# Patient Record
Sex: Female | Born: 1950 | Race: White | Hispanic: No | Marital: Single | Smoking: Never smoker
Health system: Southern US, Community
[De-identification: ages and names within clinical notes are randomized; demographics above are authoritative.]

## PROBLEM LIST (undated history)

## (undated) DIAGNOSIS — I509 Heart failure, unspecified: Secondary | ICD-10-CM

## (undated) DIAGNOSIS — I429 Cardiomyopathy, unspecified: Secondary | ICD-10-CM

## (undated) DIAGNOSIS — I10 Essential (primary) hypertension: Secondary | ICD-10-CM

## (undated) DIAGNOSIS — E785 Hyperlipidemia, unspecified: Secondary | ICD-10-CM

## (undated) HISTORY — DX: Essential (primary) hypertension: I10

## (undated) HISTORY — DX: Cardiomyopathy, unspecified: I42.9

---

## 2000-06-01 HISTORY — PX: PARTIAL HYSTERECTOMY: SHX80

## 2000-06-14 ENCOUNTER — Ambulatory Visit (HOSPITAL_COMMUNITY): Admission: RE | Admit: 2000-06-14 | Discharge: 2000-06-14 | Payer: Self-pay | Admitting: Obstetrics and Gynecology

## 2000-06-14 ENCOUNTER — Encounter: Payer: Self-pay | Admitting: Obstetrics and Gynecology

## 2001-05-27 ENCOUNTER — Inpatient Hospital Stay (HOSPITAL_COMMUNITY): Admission: RE | Admit: 2001-05-27 | Discharge: 2001-05-28 | Payer: Self-pay | Admitting: Obstetrics and Gynecology

## 2012-11-16 ENCOUNTER — Ambulatory Visit (INDEPENDENT_AMBULATORY_CARE_PROVIDER_SITE_OTHER): Payer: BC Managed Care – PPO | Admitting: Podiatry

## 2012-11-16 ENCOUNTER — Encounter: Payer: Self-pay | Admitting: Podiatry

## 2012-11-16 VITALS — BP 134/75 | HR 69

## 2012-11-16 DIAGNOSIS — M25579 Pain in unspecified ankle and joints of unspecified foot: Secondary | ICD-10-CM

## 2012-11-16 DIAGNOSIS — Q828 Other specified congenital malformations of skin: Secondary | ICD-10-CM

## 2012-11-16 NOTE — Progress Notes (Signed)
Subjective: 62 year old female presents complaining of pain from callus under ball both feet x 3 month. She has tried home remedy, scraping and self care without any improvement.   Objective: Neurologic: All epicritic and tactile sensations grossly intact. 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 Dermatologic: Calluses over 1st metatarsal bunion site, 5th toe, and plantar under 3rd MPJ and many scattered callused skin.  Vascular:  All pedal pulses are palpable. No abnormal findings on neurovascular areas.  Assessment: Multiple porokeratosis, painful bilateral. Hallux valgus with callus over bunion area. 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  Plan: Reviewed clinical findings. Debrided all lesions. Advised to have calluses trimmed as soon as they become painful.

## 2014-05-22 ENCOUNTER — Ambulatory Visit (INDEPENDENT_AMBULATORY_CARE_PROVIDER_SITE_OTHER): Payer: BC Managed Care – PPO

## 2014-05-22 VITALS — BP 143/78 | HR 69 | Resp 12

## 2014-05-22 DIAGNOSIS — Q828 Other specified congenital malformations of skin: Secondary | ICD-10-CM

## 2014-05-22 DIAGNOSIS — M79676 Pain in unspecified toe(s): Secondary | ICD-10-CM

## 2014-05-22 DIAGNOSIS — B351 Tinea unguium: Secondary | ICD-10-CM

## 2014-05-22 DIAGNOSIS — B070 Plantar wart: Secondary | ICD-10-CM

## 2014-05-22 MED ORDER — EFINACONAZOLE 10 % EX SOLN: CUTANEOUS | Status: DC

## 2014-05-22 NOTE — Progress Notes (Signed)
   Subjective:    Patient ID: Summer Watkins, female    DOB: [birth year], 63 y.o.   MRN: [id number]  HPI  PT STATED RT FOOT GREAT TOENAIL IS TENDER, THIN, AND HAVE DISCOLORATION FOR 2 MONTHS. THE TOENAIL IS ABOUT LOOK THE SAME BUT WHEN PUTTING PRESSURE ON IT IS SORE. TRIED NO TREATMENT.  Review of Systems  Skin: Positive for color change.  All other systems reviewed and are negative.      Objective:   Physical Exam Neurovascular status is intact pedal pulses are palpable epicritic and proprioceptive sensations intact patient was last seen 2 years ago at this time presents with continued issues does have keratotic lesions plantar left forefoot multiple lesions in the digital sulcus areas consistent with porokeratosis versus verruca patient also has brittle Crumley friable dystrophic nails right hallux second fourth and fifth toes right foot and third fourth and fifth left foot with dark discoloration friability and brittleness consistent with onychomycosis of nails. Remainder the exam unremarkable pedal pulses are palpable epicritic and proprioceptive sensations intact and symmetric there is normal plantar response DTRs not elicited.       Assessment & Plan:  Assessment #1 onychomycosis multiple nails and 5 bilateral feet the second diagnosis is suspect porokeratosis versus verruca plantaris are benign neoplasm skin patient given instructions for topical salicylic acid application under occlusion with duct tape Aquasol instruction sheet is given. Next  Patient is also hallux nail and second digit are debrided back deep nail clippings are obtained for fungal culture and KOH patient will initiate topical antifungal therapy prescription for Jublia is issued to the third or pharmacy online pharmacy will send medication directly to patient apply daily to affected nails for 12 months as instructed reappointed in 3-6 months for follow-up and reevaluation  Alvan Dameichard Linnae Rasool DPM

## 2014-05-22 NOTE — Patient Instructions (Signed)
WARTS (Verrucae)  Warts are caused by a virus that has invaded the skin.  They are more common in young adults and children and a small percentage will resolve on their own.  There are many types of warts including mosaic warts (large flat), vulgaris (domed warts-have pearl like appearance), and plantar warts (flat or cauliflower like appearance).  Warts are highly contagious and may be picked up from any surface.  Warts thrive in a warm moist environment and are common near pools, showers, and locker room floors.  Any microscopic cut in the skin is where the virus enters and becomes a wart.  Warts are very difficult to treat and get rid of.  Patience is necessary in the treatment of this virus.  It may take months to cure and different methods may have to be used to get rid of your wart.  Standard Initial Treatment is: 1. Periodic debridement of the wart and application of Canthacur to each lesion (a blistering agent that will slough off the warty skin) 2. Dispensing of topical treatments/prescriptions to apply to the wart at home  Other options include: 1. Excision of the lesion-numbing the skin around the wart and cutting it out-requires daily soaks post-operatively and takes about 2-3 weeks to fully heal 2. Excision with CO2 Laser-Performed at the surgical center your foot is numbed up and the lesions are all cut out and then lasered with a high power laser.  Very good for multiple warts that are resistant. 3. Cimetidine (Tagamet)-Oral agent used in high does--has shown better results in children  How do I apply the standard topical treatments?  1. Salicylic Acid (Compound W wart remover liquid or gel-available at drug or grocery stores)-Apply a dime size thickness over the wart and cover with duct tape-apply at night so the medication does not spread out to the good skin.  The skin will turn white and slowly blister off.  Use a pumice stone daily to remove the white skin as best you can.  If  the skin gets too raw and painful, discontinue for a few days then resume. 2. Aldara (Imiquimod)-this is an immune response modifier.  They come in little packets so try to get at least 2 days out of each packet if you can.  Apply a small amount to the lesion and cover with duct tape.  Do not rub it in-let it absorb on its own.  Good to apply each morning.  Other Helpful Hints:  Wash shoes that can be washed in the washing machine 2-3 x per month with some bleach  Use Lysol in shoes that cannot be washed and wipe out with a cloth 1 x per week-allow to dry for 8 hours before wearing again  Use a bleach solution (1 part bleach to 3 parts water) in your tub or shower to reduce the spread of the virus to yourself and others  Use aqua socks or clean sandals when at the pool or locker room to reduce the chance of picking up the virus or spreading it to others   Obtain more medication over-the-counter as instructed follow the instructions she to apply wart medicine to affected areas once daily cover with duct tape as instructed apply daily for 14-21 days may still useful dermatology    Onychomycosis/Fungal Toenails  WHAT IS IT? An infection that lies within the keratin of your nail plate that is caused by a fungus.  WHY ME? Fungal infections affect all ages, sexes, races, and creeds.  There  may be many factors that predispose you to a fungal infection such as age, coexisting medical conditions such as diabetes, or an autoimmune disease; stress, medications, fatigue, genetics, etc.  Bottom line: fungus thrives in a warm, moist environment and your shoes offer such a location.  IS IT CONTAGIOUS? Theoretically, yes.  You do not want to share shoes, nail clippers or files with someone who has fungal toenails.  Walking around barefoot in the same room or sleeping in the same bed is unlikely to transfer the organism.  It is important to realize, however, that fungus can spread easily from one nail to the  next on the same foot.  HOW DO WE TREAT THIS?  There are several ways to treat this condition.  Treatment may depend on many factors such as age, medications, pregnancy, liver and kidney conditions, etc.  It is best to ask your doctor which options are available to you.  1. No treatment.   Unlike many other medical concerns, you can live with this condition.  However for many people this can be a painful condition and may lead to ingrown toenails or a bacterial infection.  It is recommended that you keep the nails cut short to help reduce the amount of fungal nail. 2. Topical treatment.  These range from herbal remedies to prescription strength nail lacquers.  About 40-50% effective, topicals require twice daily application for approximately 9 to 12 months or until an entirely new nail has grown out.  The most effective topicals are medical grade medications available through physicians offices. 3. Oral antifungal medications.  With an 80-90% cure rate, the most common oral medication requires 3 to 4 months of therapy and stays in your system for a year as the new nail grows out.  Oral antifungal medications do require blood work to make sure it is a safe drug for you.  A liver function panel will be performed prior to starting the medication and after the first month of treatment.  It is important to have the blood work performed to avoid any harmful side effects.  In general, this medication safe but blood work is required. 4. Laser Therapy.  This treatment is performed by applying a specialized laser to the affected nail plate.  This therapy is noninvasive, fast, and non-painful.  It is not covered by insurance and is therefore, out of pocket.  The results have been very good with a 80-95% cure rate.  The [facility] is the only practice in the area to offer this therapy. 5. Permanent Nail Avulsion.  Removing the entire nail so that a new nail will not grow back.

## 2014-11-20 ENCOUNTER — Ambulatory Visit: Payer: Self-pay | Admitting: Podiatry

## 2015-09-23 DIAGNOSIS — I420 Dilated cardiomyopathy: Secondary | ICD-10-CM | POA: Insufficient documentation

## 2015-09-23 DIAGNOSIS — I10 Essential (primary) hypertension: Secondary | ICD-10-CM | POA: Insufficient documentation

## 2015-09-23 DIAGNOSIS — I5022 Chronic systolic (congestive) heart failure: Secondary | ICD-10-CM | POA: Insufficient documentation

## 2016-10-22 ENCOUNTER — Ambulatory Visit (INDEPENDENT_AMBULATORY_CARE_PROVIDER_SITE_OTHER): Payer: BLUE CROSS/BLUE SHIELD | Admitting: Podiatry

## 2016-10-22 ENCOUNTER — Encounter: Payer: Self-pay | Admitting: Podiatry

## 2016-10-22 VITALS — BP 141/70 | HR 65 | Ht 62.0 in | Wt 177.0 lb

## 2016-10-22 DIAGNOSIS — B351 Tinea unguium: Secondary | ICD-10-CM

## 2016-10-22 DIAGNOSIS — M25579 Pain in unspecified ankle and joints of unspecified foot: Secondary | ICD-10-CM

## 2016-10-22 DIAGNOSIS — L851 Acquired keratosis [keratoderma] palmaris et plantaris: Secondary | ICD-10-CM

## 2016-10-22 DIAGNOSIS — B353 Tinea pedis: Secondary | ICD-10-CM

## 2016-10-22 NOTE — Progress Notes (Signed)
SUBJECTIVE: 66 y.o. year old female presents complaining of pain on side of right foot and side of right foot near 5th toe and both side of the big toe. Wearing steel toed shoes for 8 hours at work.  Her last visit to this office was 11/16/12.  Objective: Neurologic: All epicritic and tactile sensations grossly intact. 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 Dermatologic: Painful calluses under 5th metatarsal base right, and right great toe medial and lateral aspect.  Thick deformed and dystrophic nails x 10. Vascular:  All pedal pulses are palpable. No abnormal findings on neurovascular areas.  Assessment: Multiple porokeratosis, painful bilateral. Hallux valgus with callus over bunion area. 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  Plan: Reviewed clinical findings. Debrided all lesions. Advised to have calluses trimmed as soon as they become painful.

## 2016-10-22 NOTE — Patient Instructions (Addendum)
Seen for pain in right foot from callus and fungal condition. All lesions and toe nails debrided. Will prescribe anti fungal foot soak. Follow instruction. Return in 2 month.

## 2016-11-05 ENCOUNTER — Telehealth: Payer: Self-pay | Admitting: *Deleted

## 2016-11-05 NOTE — Telephone Encounter (Signed)
11/05/16 Patient called this afternoon and said can you give her another RX , the Rx we faxed in wasn't covered.theophylline Foot Soak.

## 2016-12-22 ENCOUNTER — Ambulatory Visit: Payer: BLUE CROSS/BLUE SHIELD | Admitting: Podiatry

## 2017-01-12 ENCOUNTER — Ambulatory Visit (INDEPENDENT_AMBULATORY_CARE_PROVIDER_SITE_OTHER): Payer: BLUE CROSS/BLUE SHIELD | Admitting: Podiatry

## 2017-01-12 ENCOUNTER — Encounter: Payer: Self-pay | Admitting: Podiatry

## 2017-01-12 DIAGNOSIS — B351 Tinea unguium: Secondary | ICD-10-CM | POA: Diagnosis not present

## 2017-01-12 DIAGNOSIS — L851 Acquired keratosis [keratoderma] palmaris et plantaris: Secondary | ICD-10-CM

## 2017-01-12 DIAGNOSIS — M25579 Pain in unspecified ankle and joints of unspecified foot: Secondary | ICD-10-CM | POA: Diagnosis not present

## 2017-01-12 DIAGNOSIS — B353 Tinea pedis: Secondary | ICD-10-CM

## 2017-01-12 NOTE — Progress Notes (Signed)
SUBJECTIVE: 66 y.o. year old female presents requesting for follow up on fungal nails and calluses on both feet. Wearing steel toed shoes for 8 hours at work. Feet are hurting from plantar calluses.  Objective: Neurologic: All epicritic and tactile sensations grossly intact. 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 Dermatologic: Painful calluses under 5th metatarsal base right, and right great toe medial and lateral aspect.  Thick deformed and dystrophic nails x 10. Vascular:  All pedal pulses are palpable. No abnormal findings on neurovascular areas.  Assessment: Multiple porokeratosis, painful bilateral. Hallux valgus with callus over bunion area. 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  Plan: Reviewed clinical findings. Debrided all lesions and nails.  Return in 3 months.

## 2017-01-12 NOTE — Patient Instructions (Signed)
Seen for hypertrophic nails and calluses. All nails and calluses debrided. Return in 3 months or as needed.  

## 2017-04-13 DIAGNOSIS — I429 Cardiomyopathy, unspecified: Secondary | ICD-10-CM | POA: Insufficient documentation

## 2017-04-14 ENCOUNTER — Ambulatory Visit: Payer: BLUE CROSS/BLUE SHIELD | Admitting: Podiatry

## 2017-04-29 ENCOUNTER — Other Ambulatory Visit: Payer: Self-pay | Admitting: Obstetrics and Gynecology

## 2017-04-29 DIAGNOSIS — R928 Other abnormal and inconclusive findings on diagnostic imaging of breast: Secondary | ICD-10-CM

## 2017-05-03 ENCOUNTER — Ambulatory Visit: Payer: Self-pay

## 2017-05-03 ENCOUNTER — Ambulatory Visit
Admission: RE | Admit: 2017-05-03 | Discharge: 2017-05-03 | Disposition: A | Discharge status: Home / Self Care | Payer: BLUE CROSS/BLUE SHIELD | Source: Ambulatory Visit | Attending: Obstetrics and Gynecology | Admitting: Obstetrics and Gynecology

## 2017-05-03 DIAGNOSIS — R928 Other abnormal and inconclusive findings on diagnostic imaging of breast: Secondary | ICD-10-CM

## 2018-02-28 ENCOUNTER — Other Ambulatory Visit: Payer: Self-pay | Admitting: Internal Medicine

## 2018-02-28 DIAGNOSIS — G4700 Insomnia, unspecified: Secondary | ICD-10-CM

## 2018-02-28 DIAGNOSIS — Z0000 Encounter for general adult medical examination without abnormal findings: Secondary | ICD-10-CM | POA: Diagnosis not present

## 2018-02-28 DIAGNOSIS — I429 Cardiomyopathy, unspecified: Secondary | ICD-10-CM | POA: Diagnosis not present

## 2018-02-28 DIAGNOSIS — I119 Hypertensive heart disease without heart failure: Secondary | ICD-10-CM | POA: Diagnosis not present

## 2018-03-01 ENCOUNTER — Other Ambulatory Visit: Payer: Self-pay | Admitting: Internal Medicine

## 2018-03-01 LAB — COMPREHENSIVE METABOLIC PANEL
A/G RATIO: 1.6 (ref 1.2–2.2)
ALBUMIN: 4.1 g/dL (ref 3.6–4.8)
ALT: 16 IU/L (ref 0–32)
AST: 14 IU/L (ref 0–40)
Alkaline Phosphatase: 85 IU/L (ref 39–117)
BILIRUBIN TOTAL: 0.4 mg/dL (ref 0.0–1.2)
BUN/Creatinine Ratio: 16 (ref 12–28)
BUN: 13 mg/dL (ref 8–27)
CHLORIDE: 102 mmol/L (ref 96–106)
CO2: 24 mmol/L (ref 20–29)
Calcium: 9.4 mg/dL (ref 8.7–10.3)
Creatinine, Ser: 0.8 mg/dL (ref 0.57–1.00)
GFR calc non Af Amer: 77 mL/min/{1.73_m2} (ref >59)
GFR, EST AFRICAN AMERICAN: 89 mL/min/{1.73_m2} (ref >59)
GLOBULIN, TOTAL: 2.6 g/dL (ref 1.5–4.5)
Glucose: 107 mg/dL — ABNORMAL HIGH (ref 65–99)
POTASSIUM: 4.1 mmol/L (ref 3.5–5.2)
SODIUM: 140 mmol/L (ref 134–144)
TOTAL PROTEIN: 6.7 g/dL (ref 6.0–8.5)

## 2018-03-01 LAB — CBC
HEMATOCRIT: 37.1 % (ref 34.0–46.6)
Hemoglobin: 12.5 g/dL (ref 11.1–15.9)
MCH: 28 pg (ref 26.6–33.0)
MCHC: 33.7 g/dL (ref 31.5–35.7)
MCV: 83 fL (ref 79–97)
PLATELETS: 266 10*3/uL (ref 150–450)
RBC: 4.47 x10E6/uL (ref 3.77–5.28)
RDW: 14.2 % (ref 12.3–15.4)
WBC: 5.4 10*3/uL (ref 3.4–10.8)

## 2018-03-01 LAB — LIPID PANEL WITH LDL/HDL RATIO
Cholesterol, Total: 194 mg/dL (ref 100–199)
HDL: 44 mg/dL (ref >39)
LDL CALC: 136 mg/dL — AB (ref 0–99)
LDl/HDL Ratio: 3.1 ratio (ref 0.0–3.2)
Triglycerides: 72 mg/dL (ref 0–149)
VLDL CHOLESTEROL CAL: 14 mg/dL (ref 5–40)

## 2018-03-01 LAB — HGB A1C W/O EAG: HEMOGLOBIN A1C: 5.4 % (ref 4.8–5.6)

## 2018-04-16 ENCOUNTER — Other Ambulatory Visit: Payer: Self-pay | Admitting: Nurse Practitioner

## 2018-05-31 ENCOUNTER — Telehealth: Payer: Self-pay

## 2018-05-31 NOTE — Telephone Encounter (Signed)
Did someone recommend for her to take an iron pill? I will have to check her labs to see if this is needed.

## 2018-05-31 NOTE — Telephone Encounter (Signed)
Patient would like iron pill recc. She would like to know which ones are best to take. PA

## 2018-06-02 NOTE — Telephone Encounter (Signed)
She went for her mammogram, and they did a finger prick and mentioned to her that her iron was low ( does not remember the exact number ) but they told her that some OTC iron meds would help.

## 2018-07-11 ENCOUNTER — Other Ambulatory Visit: Payer: Self-pay | Admitting: Internal Medicine

## 2018-08-29 ENCOUNTER — Ambulatory Visit: Payer: BLUE CROSS/BLUE SHIELD | Admitting: Internal Medicine

## 2018-08-29 ENCOUNTER — Other Ambulatory Visit: Payer: Self-pay

## 2018-08-29 ENCOUNTER — Encounter: Payer: Self-pay | Admitting: Internal Medicine

## 2018-08-29 VITALS — BP 126/84 | HR 65 | Temp 98.0°F | Ht 66.8 in | Wt 180.0 lb

## 2018-08-29 DIAGNOSIS — I119 Hypertensive heart disease without heart failure: Secondary | ICD-10-CM

## 2018-08-29 DIAGNOSIS — I429 Cardiomyopathy, unspecified: Secondary | ICD-10-CM

## 2018-08-29 DIAGNOSIS — E7800 Pure hypercholesterolemia, unspecified: Secondary | ICD-10-CM | POA: Diagnosis not present

## 2018-08-29 DIAGNOSIS — Z1211 Encounter for screening for malignant neoplasm of colon: Secondary | ICD-10-CM

## 2018-08-29 DIAGNOSIS — Z86010 Personal history of colonic polyps: Secondary | ICD-10-CM

## 2018-08-29 LAB — BMP8+EGFR
BUN/Creatinine Ratio: 21 (ref 12–28)
BUN: 16 mg/dL (ref 8–27)
CO2: 25 mmol/L (ref 20–29)
Calcium: 9.5 mg/dL (ref 8.7–10.3)
Chloride: 105 mmol/L (ref 96–106)
Creatinine, Ser: 0.77 mg/dL (ref 0.57–1.00)
GFR calc Af Amer: 92 mL/min/{1.73_m2} (ref >59)
GFR calc non Af Amer: 80 mL/min/{1.73_m2} (ref >59)
Glucose: 102 mg/dL — ABNORMAL HIGH (ref 65–99)
Potassium: 4 mmol/L (ref 3.5–5.2)
SODIUM: 146 mmol/L — AB (ref 134–144)

## 2018-08-29 LAB — LIPID PANEL
CHOL/HDL RATIO: 4.3 ratio (ref 0.0–4.4)
Cholesterol, Total: 210 mg/dL — ABNORMAL HIGH (ref 100–199)
HDL: 49 mg/dL (ref >39)
LDL CALC: 142 mg/dL — AB (ref 0–99)
Triglycerides: 95 mg/dL (ref 0–149)
VLDL CHOLESTEROL CAL: 19 mg/dL (ref 5–40)

## 2018-08-29 NOTE — Patient Instructions (Signed)

## 2018-08-29 NOTE — Progress Notes (Signed)
Subjective:     Patient ID: Summer Watkins , female    DOB: 05/19/1951 , 68 y.o.   MRN: [id number]   Chief Complaint  Patient presents with  . Hypertension    HPI  Hypertension  This is a chronic problem. The current episode started more than 1 year ago. The problem has been gradually improving since onset. The problem is controlled. Pertinent negatives include no blurred vision, chest pain, palpitations or shortness of breath.     Past Medical History:  Diagnosis Date  . Cardiomyopathy ([place])   . High blood pressure      Family History  Problem Relation Age of Onset  . Stroke Mother   . Hypertension Mother   . Healthy Father      Current Outpatient Medications:  .  aspirin EC 81 MG tablet, Take 81 mg by mouth., Disp: , Rfl:  .  carvedilol (COREG) 6.25 MG tablet, Take 6.25 mg by mouth., Disp: , Rfl:  .  hydrochlorothiazide (MICROZIDE) 12.5 MG capsule, Take by mouth., Disp: , Rfl:  .  hydrOXYzine (ATARAX/VISTARIL) 25 MG tablet, Take by mouth., Disp: , Rfl:  .  ramipril (ALTACE) 5 MG capsule, Take by mouth., Disp: , Rfl:  .  Vitamin D, Ergocalciferol, (DRISDOL) 1.25 MG (50000 UT) CAPS capsule, TAKE 1 CAPSULE BY MOUTH ONCE A WEEK, Disp: 12 capsule, Rfl: 1   No Known Allergies   Review of Systems  Constitutional: Negative.   Eyes: Negative for blurred vision.  Respiratory: Negative.  Negative for shortness of breath.   Cardiovascular: Negative.  Negative for chest pain and palpitations.  Gastrointestinal: Negative.   Neurological: Negative.   Psychiatric/Behavioral: Negative.      Today's Vitals   08/29/18 0838  BP: 126/84  Pulse: 65  Temp: 98 F (36.7 C)  TempSrc: Oral  Weight: 180 lb (81.6 kg)  Height: 5' 6.8" (1.697 m)   Body mass index is 28.36 kg/m.   Objective:  Physical Exam Vitals signs and nursing note reviewed.  Constitutional:      Appearance: Normal appearance.  HENT:     Head: Normocephalic and atraumatic.  Cardiovascular:     Rate  and Rhythm: Normal rate and regular rhythm.     Heart sounds: Normal heart sounds.  Pulmonary:     Effort: Pulmonary effort is normal.     Breath sounds: Normal breath sounds.  Skin:    General: Skin is warm.  Neurological:     General: No focal deficit present.     Mental Status: She is alert.  Psychiatric:        Mood and Affect: Mood normal.        Behavior: Behavior normal.         Assessment And Plan:     1. Benign hypertensive heart disease without heart failure  Well controlled. She will continue with current meds. She is encouraged to avoid adding salt to her foods.   - BMP8+EGFR  2. Cardiomyopathy, unspecified type ([place])  Chronic. She is also followed by cardiology.   3. Pure hypercholesterolemia  She is encouraged to avoid fried foods and to increase her daily activity.   - Lipid panel  4. Screen for colon cancer  - Ambulatory referral to Gastroenterology  5. Personal history of colonic polyps  She reports h/o polyps. I will refer her to GI for CRC screening. I will also request her last colonoscopy report from Dr. Caprice Beaver in [place].  - Ambulatory referral to Gastroenterology  Maximino Greenland, MD

## 2018-08-31 ENCOUNTER — Telehealth: Payer: Self-pay

## 2018-08-31 NOTE — Telephone Encounter (Signed)
Left the patient a message to call back for lab results. 

## 2018-08-31 NOTE — Telephone Encounter (Signed)
-----   Message from Dorothyann Peng, MD sent at 08/30/2018  8:53 PM EDT ----- Your LDL, bad chol is 142. Ideally this should be less than 100.  Be sure to avoid fried foods.  I suggest that you incorporate more exercise into your daily routine. Your kidney fxn is stable. Your sodium level is sl. Elevated. pls be sure to stay well hydrated.

## 2018-09-06 ENCOUNTER — Other Ambulatory Visit: Payer: Self-pay

## 2018-09-06 NOTE — Telephone Encounter (Signed)
Returned the pt;s call and notified her of her most recent lab result.

## 2018-11-25 ENCOUNTER — Encounter (HOSPITAL_BASED_OUTPATIENT_CLINIC_OR_DEPARTMENT_OTHER): Payer: Self-pay | Admitting: *Deleted

## 2018-11-25 ENCOUNTER — Emergency Department (HOSPITAL_BASED_OUTPATIENT_CLINIC_OR_DEPARTMENT_OTHER)
Admission: EM | Admit: 2018-11-25 | Discharge: 2018-11-25 | Disposition: A | Discharge status: Home / Self Care | Payer: No Typology Code available for payment source | Attending: Emergency Medicine | Admitting: Emergency Medicine

## 2018-11-25 ENCOUNTER — Other Ambulatory Visit: Payer: Self-pay

## 2018-11-25 ENCOUNTER — Emergency Department (HOSPITAL_BASED_OUTPATIENT_CLINIC_OR_DEPARTMENT_OTHER): Payer: No Typology Code available for payment source

## 2018-11-25 DIAGNOSIS — Y9301 Activity, walking, marching and hiking: Secondary | ICD-10-CM | POA: Insufficient documentation

## 2018-11-25 DIAGNOSIS — Y990 Civilian activity done for income or pay: Secondary | ICD-10-CM | POA: Insufficient documentation

## 2018-11-25 DIAGNOSIS — Z79899 Other long term (current) drug therapy: Secondary | ICD-10-CM | POA: Insufficient documentation

## 2018-11-25 DIAGNOSIS — Z7982 Long term (current) use of aspirin: Secondary | ICD-10-CM | POA: Diagnosis not present

## 2018-11-25 DIAGNOSIS — W010XXA Fall on same level from slipping, tripping and stumbling without subsequent striking against object, initial encounter: Secondary | ICD-10-CM | POA: Insufficient documentation

## 2018-11-25 DIAGNOSIS — S59911A Unspecified injury of right forearm, initial encounter: Secondary | ICD-10-CM | POA: Diagnosis present

## 2018-11-25 DIAGNOSIS — S52501A Unspecified fracture of the lower end of right radius, initial encounter for closed fracture: Secondary | ICD-10-CM

## 2018-11-25 DIAGNOSIS — Y929 Unspecified place or not applicable: Secondary | ICD-10-CM | POA: Diagnosis not present

## 2018-11-25 MED ORDER — IBUPROFEN 400 MG PO TABS
600.0000 mg | ORAL_TABLET | Freq: Once | ORAL | Status: AC
  Administered 2018-11-25: 08:00:00 600 mg via ORAL
  Filled 2018-11-25: qty 1

## 2018-11-25 MED ORDER — IBUPROFEN 600 MG PO TABS: 600.0000 mg | ORAL_TABLET | Freq: Four times a day (QID) | ORAL | 0 refills | Status: DC | PRN

## 2018-11-25 MED ORDER — HYDROCODONE-ACETAMINOPHEN 5-325 MG PO TABS: 1.0000 | ORAL_TABLET | ORAL | 0 refills | Status: DC | PRN

## 2018-11-25 MED ORDER — OXYCODONE-ACETAMINOPHEN 5-325 MG PO TABS
1.0000 | ORAL_TABLET | Freq: Once | ORAL | Status: AC
  Administered 2018-11-25: 1 via ORAL
  Filled 2018-11-25: qty 1

## 2018-11-25 NOTE — ED Triage Notes (Addendum)
Tripped at work yesterday pm, fell inj to rt wrist w increased pain and swelling   Ice and cardboard split applied

## 2018-11-27 NOTE — ED Provider Notes (Signed)
[facility] EMERGENCY DEPARTMENT Provider Note   CSN: [id number] Arrival date & time: 11/25/18  [id number]     History   Chief Complaint Chief Complaint  Patient presents with  . Arm Pain    HPI Summer Watkins is a 68 y.o. female.     HPI   68 year old female with right wrist pain.  She fell at work yesterday.  She has had severe persistent right wrist pain and swelling since then.  She initially felt it may get better but then came in the emergency room today when he did not.  Denies any other injuries.  She is right-handed.  Past Medical History:  Diagnosis Date  . Cardiomyopathy ([facility])   . High blood pressure     Patient Active Problem List   Diagnosis Date Noted  . Porokeratosis 11/16/2012  . Pain in joint, ankle and foot 11/16/2012    Past Surgical History:  Procedure Laterality Date  . PARTIAL HYSTERECTOMY  2002     OB History   No obstetric history on file.      Home Medications    Prior to Admission medications   Medication Sig Start Date End Date Taking? Authorizing Provider  aspirin EC 81 MG tablet Take 81 mg by mouth.    [provider]  carvedilol (COREG) 6.25 MG tablet Take 6.25 mg by mouth. 02/24/17   [provider]  hydrochlorothiazide (MICROZIDE) 12.5 MG capsule Take by mouth.    [provider]  HYDROcodone-acetaminophen (NORCO/VICODIN) 5-325 MG tablet Take 1 tablet by mouth every 4 (four) hours as needed. 11/25/18   Raeford RazorKohut, Kashaun Bebo, MD  hydrOXYzine (ATARAX/VISTARIL) 25 MG tablet Take by mouth. 02/27/16   [provider]  ibuprofen (ADVIL) 600 MG tablet Take 1 tablet (600 mg total) by mouth every 6 (six) hours as needed. 11/25/18   Raeford RazorKohut, Caramia Boutin, MD  ramipril (ALTACE) 5 MG capsule Take by mouth.    [provider]  Vitamin D, Ergocalciferol, (DRISDOL) 1.25 MG (50000 UT) CAPS capsule TAKE 1 CAPSULE BY MOUTH ONCE A WEEK 07/11/18   Dorothyann PengSanders, Robyn, MD    Family History Family History  Problem  Relation Age of Onset  . Stroke Mother   . Hypertension Mother   . Healthy Father     Social History Social History   Tobacco Use  . Smoking status: Never Smoker  . Smokeless tobacco: Never Used  Substance Use Topics  . Alcohol use: Never    Frequency: Never  . Drug use: Never     Allergies   Patient has no known allergies.   Review of Systems Review of Systems  All systems reviewed and negative, other than as noted in HPI.  Physical Exam Updated Vital Signs BP (!) 155/83 (BP Location: Left Arm)   Pulse 68   Temp 98.2 F (36.8 C) (Oral)   Resp 16   Ht 5\' 2"  (1.575 m)   Wt 79.4 kg   SpO2 100%   BMI 32.01 kg/m   Physical Exam Vitals signs and nursing note reviewed.  Constitutional:      General: She is not in acute distress.    Appearance: She is well-developed.  HENT:     Head: Normocephalic and atraumatic.  Eyes:     General:        Right eye: No discharge.        Left eye: No discharge.     Conjunctiva/sclera: Conjunctivae normal.  Neck:     Musculoskeletal: Neck supple.  Cardiovascular:     Rate and Rhythm: Normal rate and regular rhythm.     Heart sounds: Normal heart sounds. No murmur. No friction rub. No gallop.   Pulmonary:     Effort: Pulmonary effort is normal. No respiratory distress.     Breath sounds: Normal breath sounds.  Abdominal:     General: There is no distension.     Palpations: Abdomen is soft.     Tenderness: There is no abdominal tenderness.  Musculoskeletal:        General: Tenderness present.     Comments: There is some mild swelling of the right wrist.  Some motion limited by pain.  Closed injury.  Appears to be neurovascularly intact.  Skin:    General: Skin is warm and dry.  Neurological:     Mental Status: She is alert.  Psychiatric:        Behavior: Behavior normal.        Thought Content: Thought content normal.      ED Treatments / Results  Labs (all labs ordered are listed, but only abnormal results are  displayed) Labs Reviewed - No data to display  EKG    Radiology No results found.   Dg Wrist Complete Right  Result Date: 11/25/2018 CLINICAL DATA:  Right wrist pain and swelling after fall last night. EXAM: RIGHT WRIST - COMPLETE 3+ VIEW COMPARISON:  None. FINDINGS: Irregular linear lucency through the distal radius on the lateral view. Old fracture deformity of the distal ulna. No dislocation. Positive ulnar variance with mild Madelung deformity. Mild radiocarpal joint space narrowing. Cystic change in the triquetrum. Bone mineralization is normal. Mild diffuse soft tissue swelling with volar displacement of the pronator fat pad. IMPRESSION: 1. Irregular linear lucency through the distal radius on the lateral view, concerning for possible acute nondisplaced intra-articular fracture. Recommend CT for further evaluation. 2. Mild Madelung deformity. Chronic fracture deformity of the distal ulna. Electronically Signed   By: Titus Dubin M.D.   On: 11/25/2018 08:23    Procedures Procedures (including critical care time)  Medications Ordered in ED Medications  oxyCODONE-acetaminophen (PERCOCET/ROXICET) 5-325 MG per tablet 1 tablet (1 tablet Oral Given 11/25/18 0826)  ibuprofen (ADVIL) tablet 600 mg (600 mg Oral Given 11/25/18 0825)     Initial Impression / Assessment and Plan / ED Course  I have reviewed the triage vital signs and the nursing notes.  Pertinent labs & imaging results that were available during my care of the patient were reviewed by me and considered in my medical decision making (see chart for details).        68 year old female with what I clinically suspect is a radius fracture as possibly noted on imaging.  She is neurovascularly intact.  I do not feel that CT would change management at this time.  Will place her in a splint.  As needed pain medicine.  Sling for comfort.  Work note provided.  Orthopedic follow-up.  Final Clinical Impressions(s) / ED Diagnoses    Final diagnoses:  Closed fracture of distal end of right radius, unspecified fracture morphology, initial encounter    ED Discharge Orders         Ordered    HYDROcodone-acetaminophen (NORCO/VICODIN) 5-325 MG tablet  Every 4 hours PRN     11/25/18 0849    ibuprofen (ADVIL) 600 MG tablet  Every 6 hours PRN     11/25/18 0849           Virgel Manifold, MD 11/27/18 2145

## 2018-11-30 ENCOUNTER — Other Ambulatory Visit: Payer: Self-pay | Admitting: Orthopedic Surgery

## 2018-11-30 DIAGNOSIS — M25531 Pain in right wrist: Secondary | ICD-10-CM

## 2018-12-01 ENCOUNTER — Ambulatory Visit
Admission: RE | Admit: 2018-12-01 | Discharge: 2018-12-01 | Disposition: A | Discharge status: Home / Self Care | Payer: Worker's Compensation | Source: Ambulatory Visit | Attending: Orthopedic Surgery | Admitting: Orthopedic Surgery

## 2018-12-01 ENCOUNTER — Other Ambulatory Visit: Payer: Self-pay

## 2018-12-01 DIAGNOSIS — M25531 Pain in right wrist: Secondary | ICD-10-CM

## 2018-12-25 ENCOUNTER — Other Ambulatory Visit: Payer: Self-pay | Admitting: Internal Medicine

## 2019-01-18 LAB — HM COLONOSCOPY

## 2019-01-25 ENCOUNTER — Encounter: Payer: Self-pay | Admitting: Internal Medicine

## 2019-03-28 ENCOUNTER — Encounter: Payer: Self-pay | Admitting: Internal Medicine

## 2019-04-19 ENCOUNTER — Other Ambulatory Visit: Payer: Self-pay

## 2019-04-19 ENCOUNTER — Encounter: Payer: Self-pay | Admitting: Internal Medicine

## 2019-04-19 ENCOUNTER — Ambulatory Visit (INDEPENDENT_AMBULATORY_CARE_PROVIDER_SITE_OTHER): Payer: BC Managed Care – PPO | Admitting: Internal Medicine

## 2019-04-19 VITALS — BP 114/72 | HR 76 | Temp 98.4°F | Ht 62.0 in | Wt 186.2 lb

## 2019-04-19 DIAGNOSIS — I119 Hypertensive heart disease without heart failure: Secondary | ICD-10-CM | POA: Diagnosis not present

## 2019-04-19 DIAGNOSIS — Z0000 Encounter for general adult medical examination without abnormal findings: Secondary | ICD-10-CM

## 2019-04-19 DIAGNOSIS — Z23 Encounter for immunization: Secondary | ICD-10-CM | POA: Diagnosis not present

## 2019-04-19 DIAGNOSIS — Z6834 Body mass index (BMI) 34.0-34.9, adult: Secondary | ICD-10-CM

## 2019-04-19 DIAGNOSIS — E6609 Other obesity due to excess calories: Secondary | ICD-10-CM

## 2019-04-19 DIAGNOSIS — I429 Cardiomyopathy, unspecified: Secondary | ICD-10-CM

## 2019-04-19 DIAGNOSIS — E559 Vitamin D deficiency, unspecified: Secondary | ICD-10-CM

## 2019-04-19 LAB — POCT URINALYSIS DIPSTICK
Bilirubin, UA: NEGATIVE
Glucose, UA: NEGATIVE
Ketones, UA: NEGATIVE
Leukocytes, UA: NEGATIVE
Nitrite, UA: NEGATIVE
Protein, UA: NEGATIVE
Spec Grav, UA: 1.025 (ref 1.010–1.025)
Urobilinogen, UA: 1 E.U./dL
pH, UA: 5.5 (ref 5.0–8.0)

## 2019-04-19 LAB — POCT UA - MICROALBUMIN
Albumin/Creatinine Ratio, Urine, POC: <30
Creatinine, POC: 100 mg/dL
Microalbumin Ur, POC: 10 mg/L

## 2019-04-20 LAB — CMP14+EGFR
ALT: 17 IU/L (ref 0–32)
AST: 16 IU/L (ref 0–40)
Albumin/Globulin Ratio: 1.5 (ref 1.2–2.2)
Albumin: 4.2 g/dL (ref 3.8–4.8)
Alkaline Phosphatase: 98 IU/L (ref 39–117)
BUN/Creatinine Ratio: 14 (ref 12–28)
BUN: 11 mg/dL (ref 8–27)
Bilirubin Total: 0.4 mg/dL (ref 0.0–1.2)
CO2: 26 mmol/L (ref 20–29)
Calcium: 9.2 mg/dL (ref 8.7–10.3)
Chloride: 102 mmol/L (ref 96–106)
Creatinine, Ser: 0.78 mg/dL (ref 0.57–1.00)
GFR calc Af Amer: 91 mL/min/{1.73_m2} (ref >59)
GFR calc non Af Amer: 79 mL/min/{1.73_m2} (ref >59)
Globulin, Total: 2.8 g/dL (ref 1.5–4.5)
Glucose: 97 mg/dL (ref 65–99)
Potassium: 4.1 mmol/L (ref 3.5–5.2)
Sodium: 140 mmol/L (ref 134–144)
Total Protein: 7 g/dL (ref 6.0–8.5)

## 2019-04-20 LAB — HEMOGLOBIN A1C
Est. average glucose Bld gHb Est-mCnc: 114 mg/dL
Hgb A1c MFr Bld: 5.6 % (ref 4.8–5.6)

## 2019-04-20 LAB — CBC
Hematocrit: 41.3 % (ref 34.0–46.6)
Hemoglobin: 13 g/dL (ref 11.1–15.9)
MCH: 27.1 pg (ref 26.6–33.0)
MCHC: 31.5 g/dL (ref 31.5–35.7)
MCV: 86 fL (ref 79–97)
Platelets: 308 10*3/uL (ref 150–450)
RBC: 4.8 x10E6/uL (ref 3.77–5.28)
RDW: 13.4 % (ref 11.7–15.4)
WBC: 6.2 10*3/uL (ref 3.4–10.8)

## 2019-04-20 LAB — VITAMIN D 25 HYDROXY (VIT D DEFICIENCY, FRACTURES): Vit D, 25-Hydroxy: 57.9 ng/mL (ref 30.0–100.0)

## 2019-04-20 LAB — LIPID PANEL
Chol/HDL Ratio: 4.6 ratio — ABNORMAL HIGH (ref 0.0–4.4)
Cholesterol, Total: 211 mg/dL — ABNORMAL HIGH (ref 100–199)
HDL: 46 mg/dL (ref >39)
LDL Chol Calc (NIH): 147 mg/dL — ABNORMAL HIGH (ref 0–99)
Triglycerides: 102 mg/dL (ref 0–149)
VLDL Cholesterol Cal: 18 mg/dL (ref 5–40)

## 2019-04-21 ENCOUNTER — Other Ambulatory Visit: Payer: Self-pay

## 2019-04-21 ENCOUNTER — Encounter: Payer: Self-pay | Admitting: Internal Medicine

## 2019-04-21 NOTE — Progress Notes (Signed)
Subjective:     Patient ID: Summer Watkins , female    DOB: [birth year] , 68 y.o.   MRN: [id number]   Chief Complaint  Patient presents with  . Annual Exam  . Immunizations    pneumonia    HPI  She is here today for a full physical examination. She is followed by GYN for her pelvic exams. She has no specific concerns at this time. She states she plans to retire next year.   Hypertension This is a chronic problem. The current episode started more than 1 year ago. The problem has been gradually improving since onset. The problem is controlled. Pertinent negatives include no blurred vision, chest pain, palpitations or shortness of breath.     Past Medical History:  Diagnosis Date  . Cardiomyopathy ([facility])   . High blood pressure      Family History  Problem Relation Age of Onset  . Stroke Mother   . Hypertension Mother   . Healthy Father      Current Outpatient Medications:  .  aspirin EC 81 MG tablet, Take 81 mg by mouth., Disp: , Rfl:  .  carvedilol (COREG) 6.25 MG tablet, Take 6.25 mg by mouth., Disp: , Rfl:  .  hydrochlorothiazide (MICROZIDE) 12.5 MG capsule, Take by mouth., Disp: , Rfl:  .  hydrOXYzine (ATARAX/VISTARIL) 25 MG tablet, Take by mouth., Disp: , Rfl:  .  ibuprofen (ADVIL) 600 MG tablet, Take 1 tablet (600 mg total) by mouth every 6 (six) hours as needed., Disp: 30 tablet, Rfl: 0 .  Vitamin D, Ergocalciferol, (DRISDOL) 1.25 MG (50000 UT) CAPS capsule, TAKE 1 CAPSULE BY MOUTH ONE TIME PER WEEK, Disp: 12 capsule, Rfl: 1 .  atorvastatin (LIPITOR) 20 MG tablet, Take 20 mg by mouth daily. 1 tab on Monday Wednesday Friday, Disp: , Rfl:    No Known Allergies    The patient states she uses status post hysterectomy for birth control. Last LMP was No LMP recorded. Patient has had a hysterectomy.Negative for: breast discharge, breast lump(s), breast pain and breast self exam. Associated symptoms include abnormal vaginal bleeding. Pertinent negatives include abnormal  bleeding (hematology), anxiety, decreased libido, depression, difficulty falling sleep, dyspareunia, history of infertility, nocturia, sexual dysfunction, sleep disturbances, urinary incontinence, urinary urgency, vaginal discharge and vaginal itching. Diet regular.The patient states her exercise level is    . The patient's tobacco use is:  Social History   Tobacco Use  Smoking Status Never Smoker  Smokeless Tobacco Never Used  . She has been exposed to passive smoke. The patient's alcohol use is:  Social History   Substance and Sexual Activity  Alcohol Use Never  . Frequency: Never    Review of Systems  Constitutional: Negative.   HENT: Negative.   Eyes: Negative.  Negative for blurred vision.  Respiratory: Negative.  Negative for shortness of breath.   Cardiovascular: Negative.  Negative for chest pain and palpitations.  Endocrine: Negative.   Genitourinary: Negative.   Musculoskeletal: Negative.   Skin: Negative.   Allergic/Immunologic: Negative.   Neurological: Negative.   Hematological: Negative.   Psychiatric/Behavioral: Negative.      Today's Vitals   04/19/19 1056  BP: 114/72  Pulse: 76  Temp: 98.4 F (36.9 C)  TempSrc: Oral  Weight: 186 lb 3.2 oz (84.5 kg)  Height: '5\' 2"'  (1.575 m)   Body mass index is 34.06 kg/m.   Objective:  Physical Exam Vitals signs and nursing note reviewed.  Constitutional:      Appearance:  Normal appearance. She is obese.  HENT:     Head: Normocephalic and atraumatic.     Right Ear: Tympanic membrane, ear canal and external ear normal.     Left Ear: Tympanic membrane, ear canal and external ear normal.     Nose: Nose normal.     Mouth/Throat:     Mouth: Mucous membranes are moist.     Pharynx: Oropharynx is clear.  Eyes:     Extraocular Movements: Extraocular movements intact.     Conjunctiva/sclera: Conjunctivae normal.     Pupils: Pupils are equal, round, and reactive to light.  Neck:     Musculoskeletal: Normal range of  motion and neck supple.  Cardiovascular:     Rate and Rhythm: Normal rate and regular rhythm.     Pulses: Normal pulses.     Heart sounds: Normal heart sounds.  Pulmonary:     Effort: Pulmonary effort is normal.     Breath sounds: Normal breath sounds.  Chest:     Breasts: Tanner Score is 5.        Right: Normal.        Left: Normal.  Abdominal:     General: Bowel sounds are normal.     Comments: Obese, soft.   Genitourinary:    Comments: deferred Musculoskeletal: Normal range of motion.  Skin:    General: Skin is warm and dry.  Neurological:     General: No focal deficit present.     Mental Status: She is alert and oriented to person, place, and time.  Psychiatric:        Mood and Affect: Mood normal.        Behavior: Behavior normal.         Assessment And Plan:   1. Routine general medical examination at health care facility  A full exam was performed.  Importance of monthly self breast exams was discussed with the patient. PATIENT HAS BEEN ADVISED TO GET 30-45 MINUTES REGULAR EXERCISE NO LESS THAN FOUR TO FIVE DAYS PER WEEK - BOTH WEIGHTBEARING EXERCISES AND AEROBIC ARE RECOMMENDED.  SHE WAS ADVISED TO FOLLOW A HEALTHY DIET WITH AT LEAST SIX FRUITS/VEGGIES PER DAY, DECREASE INTAKE OF RED MEAT, AND TO INCREASE FISH INTAKE TO TWO DAYS PER WEEK.  MEATS/FISH SHOULD NOT BE FRIED, BAKED OR BROILED IS PREFERABLE.  I SUGGEST WEARING SPF 50 SUNSCREEN ON EXPOSED PARTS AND ESPECIALLY WHEN IN THE DIRECT SUNLIGHT FOR AN EXTENDED PERIOD OF TIME.  PLEASE AVOID FAST FOOD RESTAURANTS AND INCREASE YOUR WATER INTAKE.  - CMP14+EGFR - CBC - Lipid panel - Hemoglobin A1c  2. Benign hypertensive heart disease without heart failure  Chronic, well controlled. She will continue with med. She is encouraged to avoid adding salt to her foods.  EKG performed, no new changes noted. She will rto in six months for re-evaluation.   - EKG 12-Lead - POCT Urinalysis Dipstick (81002) - POCT UA -  Microalbumin  3. Cardiomyopathy, unspecified type (HCC)  Chronic, yet stable. She is also followed by Cardiology.   4. Immunization due  She was given pneumovax-23 to update her immunization history.   5. Vitamin D deficiency disease  I WILL CHECK A VIT D LEVEL AND SUPPLEMENT AS NEEDED.  ALSO ENCOURAGED TO SPEND 15 MINUTES IN THE SUN DAILY.  - Vitamin D (25 hydroxy)  6. Class 1 obesity due to excess calories with serious comorbidity and body mass index (BMI) of 34.0 to 34.9 in adult  Importance of achieving optimal weight to  decrease risk of cardiovascular disease and cancers was discussed with the patient in full detail. She is encouraged to start slowly - start with 10 minutes twice daily at least three to four days per week and to gradually build to 30 minutes five days weekly. She was given tips to incorporate more activity into her daily routine - take stairs when possible, park farther away from her job, grocery stores, etc.     Maximino Greenland, MD    THE PATIENT IS ENCOURAGED TO PRACTICE SOCIAL DISTANCING DUE TO THE COVID-19 PANDEMIC.

## 2019-04-21 NOTE — Patient Instructions (Signed)
Health Maintenance, Female Adopting a healthy lifestyle and getting preventive care are important in promoting health and wellness. Ask your health care provider about:  The right schedule for you to have regular tests and exams.  Things you can do on your own to prevent diseases and keep yourself healthy. What should I know about diet, weight, and exercise? Eat a healthy diet   Eat a diet that includes plenty of vegetables, fruits, low-fat dairy products, and lean protein.  Do not eat a lot of foods that are high in solid fats, added sugars, or sodium. Maintain a healthy weight Body mass index (BMI) is used to identify weight problems. It estimates body fat based on height and weight. Your health care provider can help determine your BMI and help you achieve or maintain a healthy weight. Get regular exercise Get regular exercise. This is one of the most important things you can do for your health. Most adults should:  Exercise for at least 150 minutes each week. The exercise should increase your heart rate and make you sweat (moderate-intensity exercise).  Do strengthening exercises at least twice a week. This is in addition to the moderate-intensity exercise.  Spend less time sitting. Even light physical activity can be beneficial. Watch cholesterol and blood lipids Have your blood tested for lipids and cholesterol at 67 years of age, then have this test every 5 years. Have your cholesterol levels checked more often if:  Your lipid or cholesterol levels are high.  You are older than 67 years of age.  You are at high risk for heart disease. What should I know about cancer screening? Depending on your health history and family history, you may need to have cancer screening at various ages. This may include screening for:  Breast cancer.  Cervical cancer.  Colorectal cancer.  Skin cancer.  Lung cancer. What should I know about heart disease, diabetes, and high blood  pressure? Blood pressure and heart disease  High blood pressure causes heart disease and increases the risk of stroke. This is more likely to develop in people who have high blood pressure readings, are of African descent, or are overweight.  Have your blood pressure checked: ? Every 3-5 years if you are 18-39 years of age. ? Every year if you are 40 years old or older. Diabetes Have regular diabetes screenings. This checks your fasting blood sugar level. Have the screening done:  Once every three years after age 40 if you are at a normal weight and have a low risk for diabetes.  More often and at a younger age if you are overweight or have a high risk for diabetes. What should I know about preventing infection? Hepatitis B If you have a higher risk for hepatitis B, you should be screened for this virus. Talk with your health care provider to find out if you are at risk for hepatitis B infection. Hepatitis C Testing is recommended for:  Everyone born from 1945 through 1965.  Anyone with known risk factors for hepatitis C. Sexually transmitted infections (STIs)  Get screened for STIs, including gonorrhea and chlamydia, if: ? You are sexually active and are younger than 67 years of age. ? You are older than 67 years of age and your health care provider tells you that you are at risk for this type of infection. ? Your sexual activity has changed since you were last screened, and you are at increased risk for chlamydia or gonorrhea. Ask your health care provider if   you are at risk.  Ask your health care provider about whether you are at high risk for HIV. Your health care provider may recommend a prescription medicine to help prevent HIV infection. If you choose to take medicine to prevent HIV, you should first get tested for HIV. You should then be tested every 3 months for as long as you are taking the medicine. Pregnancy  If you are about to stop having your period (premenopausal) and  you may become pregnant, seek counseling before you get pregnant.  Take 400 to 800 micrograms (mcg) of folic acid every day if you become pregnant.  Ask for birth control (contraception) if you want to prevent pregnancy. Osteoporosis and menopause Osteoporosis is a disease in which the bones lose minerals and strength with aging. This can result in bone fractures. If you are 65 years old or older, or if you are at risk for osteoporosis and fractures, ask your health care provider if you should:  Be screened for bone loss.  Take a calcium or vitamin D supplement to lower your risk of fractures.  Be given hormone replacement therapy (HRT) to treat symptoms of menopause. Follow these instructions at home: Lifestyle  Do not use any products that contain nicotine or tobacco, such as cigarettes, e-cigarettes, and chewing tobacco. If you need help quitting, ask your health care provider.  Do not use street drugs.  Do not share needles.  Ask your health care provider for help if you need support or information about quitting drugs. Alcohol use  Do not drink alcohol if: ? Your health care provider tells you not to drink. ? You are pregnant, may be pregnant, or are planning to become pregnant.  If you drink alcohol: ? Limit how much you use to 0-1 drink a day. ? Limit intake if you are breastfeeding.  Be aware of how much alcohol is in your drink. In the [place] one drink equals one 12 oz bottle of beer (355 mL), one 5 oz glass of wine (148 mL), or one 1 oz glass of hard liquor (44 mL). General instructions  Schedule regular health, dental, and eye exams.  Stay current with your vaccines.  Tell your health care provider if: ? You often feel depressed. ? You have ever been abused or do not feel safe at home. Summary  Adopting a healthy lifestyle and getting preventive care are important in promoting health and wellness.  Follow your health care provider's instructions about healthy  diet, exercising, and getting tested or screened for diseases.  Follow your health care provider's instructions on monitoring your cholesterol and blood pressure. This information is not intended to replace advice given to you by your health care provider. Make sure you discuss any questions you have with your health care provider. Document Released: 12/01/2010 Document Revised: 05/11/2018 Document Reviewed: 05/11/2018 Elsevier Patient Education  2020 Elsevier Inc.  

## 2019-06-05 ENCOUNTER — Other Ambulatory Visit: Payer: Self-pay | Admitting: Internal Medicine

## 2019-06-12 DIAGNOSIS — I519 Heart disease, unspecified: Secondary | ICD-10-CM | POA: Insufficient documentation

## 2019-06-12 DIAGNOSIS — E785 Hyperlipidemia, unspecified: Secondary | ICD-10-CM | POA: Insufficient documentation

## 2019-06-12 DIAGNOSIS — E7800 Pure hypercholesterolemia, unspecified: Secondary | ICD-10-CM | POA: Insufficient documentation

## 2019-06-12 NOTE — Progress Notes (Signed)
 Patient Name: Summer Watkins Patient Date of Birth: 12-14-1950 Patient MR#: [id number]  PCP: CATHERYN LOISE SLOCUMB, MD Date: 06/12/2019      Cardiology Clinic Note  Assessment and Plan:    1.  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 This was diagnosed in 2003, at which time she had a heart catheterization showing normal coronary arteries and an EF of 35 to 40%.  Her most recent echocardiogram from June 2019 showed 40 to 45%.  This is not changed appreciably since her previous echo from 2014.  Currently, she is doing well, NYHA class I-IIa.  She is not exercising regularly, and I advised her to do so.  As before, we talked about a low-salt diet and a cardiac healthy diet.  She is continuing the same medicines for her heart failure, including carvedilol, ramipril, and a thiazide diuretic.  All of this seems to be stable and doing fine.  Anticipate doing another echocardiogram in 2024 (about every 5 years).  2. Hypertension.Her blood pressure is okay today, but not perfect.  It is a little higher than I would like at 132/78.  I did not change her medicines today, but advised a low-salt diet and more exercise to try to help her blood pressure.  3.  Hyperlipidemia.    In July 2020, the LDL was 139, unchanged from December 2019.  Today, she revealed that she is taking her atorvastatin on a as needed basis.  I explained that this is not likely to be effective.  I advised her to take a atorvastatin 20 mg every evening.  We will reassess her lipids at her next appointment.  Target LDL is less than 100.    ORDERS PLACED TODAY: 1.    FOLLOW UP: The patient will come back to see me in 12 months, sooner if needed.     Alverna SAUNDERS. Toribio DO [facility] Interventional Cardiology and Peripheral Vascular Interventions     Subjective:    Reason for Consult/Visit: Follow-up (6 month)    History of Present Illness:Summer Watkins is a 69 y.o. female who  presents for routine follow-up appointment for her chronic systolic heart failure.  The patient has been doing generally well lately.  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  She has cut back her hours from about 70 hours a week to about 50 hours a week and feels better with this, but still feels like she is working too much.  She does not have chest pain or other symptoms of angina.  She does not have edema, orthopnea, or nocturnal dyspnea.  She does not feel exertional dyspnea with her usual activities, but does not do any kind of exertion.  With things like carrying groceries in from the car, she does not have much trouble with that.  She does not feel palpitations.  No lightheadedness or syncope.  No symptoms of stroke or TIA.  No claudication.  She takes her heart failure medicines regularly.  However, she has been taking her atorvastatin sporadically.  Medication side effects:none  The following portions of the patient's history were reviewed and updated as appropriate:  Medical History: Past Medical History:  Diagnosis Date  . Cardiomyopathy ([facility])   . CHF (congestive heart failure) ([facility])       Medications:  Current Outpatient Medications  Medication Sig Dispense Refill  . aspirin 81 MG chewable tablet *ANTIPLATELET* Take by  mouth.    . atorvastatin (LIPITOR) 20 MG tablet Take 1 tablet (20 mg total) by mouth daily. (Patient taking differently: Take 20 mg by mouth as needed.   ) 90 tablet 3  . carvediloL (COREG) 12.5 MG tablet TAKE 1 TABLET BY MOUTH TWICE A DAY WITH FOOD 180 tablet 3  . ergocalciferol  (VITAMIN D2) 50,000 unit capsule TAKE ONE CAPSULE BY MOUTH TWICE A WEEK ON TUES/FRI  2  . hydroCHLOROthiazide (MICROZIDE) 12.5 mg capsule Take 1 capsule (12.5 mg total) by mouth daily. 90 capsule 2  . ramipriL (ALTACE) 5 MG capsule TAKE 1 CAPSULE BY MOUTH EVERY DAY 90 capsule 3   No current facility-administered  medications for this visit.     Allergies: No Known Allergies  Social History: Social History   Tobacco Use  Smoking Status Never Smoker  Smokeless Tobacco Never Used   Social History   Substance and Sexual Activity  Alcohol Use Not Currently   Social History   Substance and Sexual Activity  Drug Use Not on file    Family History: There is no family history of premature coronary artery disease in any primary relatives. History reviewed. No pertinent family history.  Review of Systems A complete ROS was performed with pertinent positives/negatives noted in the HPI. The remainder of the ROS are negative.    Objective:     Physical Exam:  VITAL SIGNS:  Blood pressure 132/78, pulse 67, height 1.575 m (5' 2), weight 85.6 kg (188 lb 12.8 oz). Wt Readings from Last 3 Encounters:  06/12/19 85.6 kg (188 lb 12.8 oz)  12/12/18 81.2 kg (179 lb)  05/03/18 80.3 kg (177 lb)   Body mass index is 34.53 kg/m.  General Appearance:   Middle-aged African-American woman who appears younger than stated age.  Short stature.  Moderately overweight.  She is breathing easily on room air.  She does not wear oxygen.  She does not use a cane.   HEENT:   PERRL, EOMI.  Oropharynx is moist. No icterus.   Neck: Carotid pulses are 2+.  No bruits.  No adenopathy, thyromegaly or masses.  Jugular venous pressure is normal.   Lungs:   Respirations are unlabored. The lungs are clear in all fields bilaterally without rales, rhonchi or wheezing.    Percussion note is normal throughout.   Heart:  Heart sounds are regular with a rate of approximately 65-70 beats per minute.  Normal S1. Normal S2.  No S3 or S4.  There are no murmurs.    Abdomen:   Soft, nontender and nondistended.  There are normal bowel sounds.  No bruits.  No masses or organomegaly.    Extremities: Warm. No rashes or ulcers. No lower extremity pitting edema.   Pulses: Radial pulses are 2+ and symmetrical.   Skin: No lower  extremity rashes or ulcers   Neurologic:  The patient is awake, alert, and oriented to time, place, person and situation, and no strength deficits.      EKG:   Lab Review: No results for input(s): WBC, ADJUSTEDWBC, RBC, HGB, HCT, MCV, MCH, MCHC, RDW, PLT, MPV in the last 168 hours.. No results for input(s): NA, K, CL, BUN, CREATININE in the last 168 hours.  Invalid input(s): C02, GFR,  GLU Lab Results  Component Value Date   LDL 139 12/12/2018   HDL 46 12/12/2018   BNP 141.0 (H) 05/03/2018   Lab Results  Component Value Date   HGB 12.6 05/03/2018    Lab Results  Component  Value Date   CREATININE 0.71 05/03/2018   Lab Results  Component Value Date   LDLCALC 133 (H) 10/05/2016      CVD RISK STRATIFICATION RESULTS: Lab Results  Component Value Date   CHOL 201 (H) 12/12/2018   LDL 139 12/12/2018   HDL 46 12/12/2018    The 10-year ASCVD risk score Verdon DC Jr., et al., 2013) is: 11.1%   Electronically signed by: Alverna Lamar Sieving, DO 06/12/19 1216

## 2019-07-10 NOTE — Telephone Encounter (Signed)
**  TRIAGE CALL**  Reason for Call:  FMLA paperwork  Problem/Patient Complaint:  Pt calling inquiring about her FMLA paperwork. Advise [phone]  Onset Date:   07/10/19  Cardiologist/Vascular surgeon:  Toribio

## 2019-07-18 ENCOUNTER — Other Ambulatory Visit: Payer: Self-pay

## 2019-07-18 ENCOUNTER — Ambulatory Visit (INDEPENDENT_AMBULATORY_CARE_PROVIDER_SITE_OTHER): Payer: BC Managed Care – PPO

## 2019-07-18 ENCOUNTER — Other Ambulatory Visit: Payer: Self-pay | Admitting: Sports Medicine

## 2019-07-18 ENCOUNTER — Encounter: Payer: Self-pay | Admitting: Sports Medicine

## 2019-07-18 ENCOUNTER — Ambulatory Visit: Payer: BC Managed Care – PPO | Admitting: Sports Medicine

## 2019-07-18 VITALS — BP 128/72 | HR 69 | Temp 97.3°F | Resp 16

## 2019-07-18 DIAGNOSIS — M79671 Pain in right foot: Secondary | ICD-10-CM | POA: Diagnosis not present

## 2019-07-18 DIAGNOSIS — M19079 Primary osteoarthritis, unspecified ankle and foot: Secondary | ICD-10-CM

## 2019-07-18 DIAGNOSIS — M779 Enthesopathy, unspecified: Secondary | ICD-10-CM

## 2019-07-18 DIAGNOSIS — M67471 Ganglion, right ankle and foot: Secondary | ICD-10-CM | POA: Diagnosis not present

## 2019-07-18 MED ORDER — DICLOFENAC SODIUM 1 % EX GEL: 4.0000 g | Freq: Four times a day (QID) | CUTANEOUS | 3 refills | Status: DC

## 2019-07-18 MED ORDER — IBUPROFEN 800 MG PO TABS: 800.0000 mg | ORAL_TABLET | Freq: Two times a day (BID) | ORAL | 0 refills | Status: DC

## 2019-07-18 NOTE — Progress Notes (Signed)
Subjective: Summer Watkins is a 69 y.o. female patient who presents to office for evaluation of right foot pain. Patient complains of progressive pain especially over the last 2 weeks in the right foot at the top. Ranks pain 9/10 but with Motrin pain is 2/10. Patient has tried Motrin with some relief in symptoms. Patient denies any other pedal complaints. Denies injury/trip/fall/sprain/any causative factors.   Review of Systems  All other systems reviewed and are negative.   Patient Active Problem List   Diagnosis Date Noted  . Hyperlipidemia 06/12/2019  . Heart disease 06/12/2019  . Pure hypercholesterolemia 06/12/2019  . Idiopathic cardiomyopathy ([facility]CC) 04/13/2017  . Benign essential hypertension 09/23/2015  . Chronic systolic (congestive) heart failure ([facility]CC) 09/23/2015  . Primary idiopathic dilated cardiomyopathy ([facility]CC) 09/23/2015  . Porokeratosis 11/16/2012  . Pain in joint, ankle and foot 11/16/2012    Current Outpatient Medications on File Prior to Visit  Medication Sig Dispense Refill  . aspirin EC 81 MG tablet Take 81 mg by mouth.    Marland Kitchen atorvastatin (LIPITOR) 20 MG tablet Take 20 mg by mouth daily. 1 tab on Monday Wednesday Friday    . carvedilol (COREG) 6.25 MG tablet Take 6.25 mg by mouth.    . hydrochlorothiazide (MICROZIDE) 12.5 MG capsule Take by mouth.    . hydrOXYzine (ATARAX/VISTARIL) 25 MG tablet Take by mouth.    Marland Kitchen ibuprofen (ADVIL) 600 MG tablet Take 1 tablet (600 mg total) by mouth every 6 (six) hours as needed. 30 tablet 0  . ramipril (ALTACE) 5 MG capsule ramipril 5 mg capsule  TAKE 1 CAPSULE BY MOUTH EVERY DAY    . Vitamin D, Ergocalciferol, (DRISDOL) 1.25 MG (50000 UT) CAPS capsule TAKE 1 CAPSULE BY MOUTH ONE TIME PER WEEK 12 capsule 1   No current facility-administered medications on file prior to visit.    No Known Allergies  Objective:  General: Alert and oriented x3 in no acute distress  Dermatology: No open lesions bilateral lower extremities, no  webspace macerations, no ecchymosis bilateral, all nails x 10 are well manicured.  Vascular: Dorsalis Pedis and Posterior Tibial pedal pulses palpable, Capillary Fill Time 3 seconds,(+) pedal hair growth bilateral, no edema bilateral lower extremities, Temperature gradient within normal limits.  Neurology: Michaell Cowing sensation intact via light touch bilateral.  Musculoskeletal: Mild tenderness with palpation at dorsal right midfoot at area of bony prominence, + Pes planus, + Bunion + mild lesser hammertoe. Strength within normal limits in all groups bilateral.   Gait: Antalgic gait  Xrays  Right Foot   Impression:Midtarsal breach supportive of pes planus with dorsal bone spur, increase IM supportive of bunion and lesser hammertoe  Assessment and Plan: Problem List Items Addressed This Visit    None    Visit Diagnoses    Right foot pain    -  Primary   Arthritis of foot       Relevant Medications   ibuprofen (ADVIL) 800 MG tablet   Capsulitis           -Complete examination performed -Xrays reviewed -Discussed treatment options -Patient declined steroid injection to right foot  -Rx Motrin -Rx Voltaren to use topically as needed for right foot pain -Advised patient to continue with good supportive shoes -Patient to return to office as needed or if no better after 1 month or sooner if condition worsens.  Asencion Islam, DPM

## 2019-07-20 ENCOUNTER — Ambulatory Visit: Payer: BC Managed Care – PPO | Admitting: Podiatry

## 2019-08-05 ENCOUNTER — Ambulatory Visit: Payer: BC Managed Care – PPO | Attending: Internal Medicine

## 2019-08-05 DIAGNOSIS — Z23 Encounter for immunization: Secondary | ICD-10-CM

## 2019-08-05 NOTE — Progress Notes (Signed)
   Covid-19 Vaccination Clinic  Name:  Summer Watkins    MRN: [id number] DOB: 1950/12/30  08/05/2019  [name] was observed post Covid-19 immunization for 15 minutes without incident. She was provided with Vaccine Information Sheet and instruction to access the V-Safe system.   [name] was instructed to call 911 with any severe reactions post vaccine: Marland Kitchen Difficulty breathing  . Swelling of face and throat  . A fast heartbeat  . A bad rash all over body  . Dizziness and weakness   Immunizations Administered    Name Date Dose VIS Date Route   Pfizer COVID-19 Vaccine 08/05/2019  8:58 AM 0.3 mL 05/12/2019 Intramuscular   Manufacturer: [employer], [employer]   Lot: [id number]   NDC: [address]-2

## 2019-08-26 ENCOUNTER — Ambulatory Visit: Payer: BC Managed Care – PPO | Attending: Internal Medicine

## 2019-08-26 DIAGNOSIS — Z23 Encounter for immunization: Secondary | ICD-10-CM

## 2019-08-26 NOTE — Progress Notes (Signed)
   Covid-19 Vaccination Clinic  Name:  Summer Watkins    MRN: [id number] DOB: 1950/08/21  08/26/2019  [name] was observed post Covid-19 immunization for 15 minutes without incident. She was provided with Vaccine Information Sheet and instruction to access the V-Safe system.   [name] was instructed to call 911 with any severe reactions post vaccine: Marland Kitchen Difficulty breathing  . Swelling of face and throat  . A fast heartbeat  . A bad rash all over body  . Dizziness and weakness   Immunizations Administered    Name Date Dose VIS Date Route   Pfizer COVID-19 Vaccine 08/26/2019  9:10 AM 0.3 mL 05/12/2019 Intramuscular   Manufacturer: [employer], [employer]   Lot: [id number]   NDC: [address]-8

## 2019-10-19 ENCOUNTER — Ambulatory Visit (INDEPENDENT_AMBULATORY_CARE_PROVIDER_SITE_OTHER): Payer: BC Managed Care – PPO | Admitting: Internal Medicine

## 2019-10-19 ENCOUNTER — Encounter: Payer: Self-pay | Admitting: Internal Medicine

## 2019-10-19 ENCOUNTER — Other Ambulatory Visit: Payer: Self-pay

## 2019-10-19 VITALS — BP 132/86 | HR 78 | Temp 98.1°F | Ht 62.0 in | Wt 187.8 lb

## 2019-10-19 DIAGNOSIS — M25531 Pain in right wrist: Secondary | ICD-10-CM | POA: Diagnosis not present

## 2019-10-19 DIAGNOSIS — I420 Dilated cardiomyopathy: Secondary | ICD-10-CM

## 2019-10-19 DIAGNOSIS — L918 Other hypertrophic disorders of the skin: Secondary | ICD-10-CM

## 2019-10-19 DIAGNOSIS — E6609 Other obesity due to excess calories: Secondary | ICD-10-CM

## 2019-10-19 DIAGNOSIS — I110 Hypertensive heart disease with heart failure: Secondary | ICD-10-CM | POA: Diagnosis not present

## 2019-10-19 DIAGNOSIS — Z6834 Body mass index (BMI) 34.0-34.9, adult: Secondary | ICD-10-CM

## 2019-10-19 DIAGNOSIS — I5022 Chronic systolic (congestive) heart failure: Secondary | ICD-10-CM

## 2019-10-19 NOTE — Patient Instructions (Signed)
Skin Tag Removal Wound Care instructions  . Sometimes bandages are not necessary.  If a bandage is used, leave the original bandage on for 24 hours if possible.  If the bandage becomes soaked or soiled before that time, it is OK to remove it and examine the wound.  A small amount of post-operative bleeding is normal.  If excessive bleeding occurs, remove the bandage, place gauze over the site and apply continuous pressure (no peeking) over the area for 20-30 minutes.  If this does not stop the bleeding, try again for 40 minutes.  If this does not work, please call our clinic as soon as possible (even if after hours).    . Once a day, cleanse the wound with soap and water.  If a thick crust develops you may use a Q-tip dipped into dilute hydrogen peroxide (mix 1:1 with water) to dissolve it.  Hydrogen peroxide can slow the healing process, so use it only as needed.  After washing, apply Vaseline jelly or Polysporin ointment.  For best healing, the wound should be covered with a layer of ointment at all times.  This may mean re-applying the ointment several times a day.  For open wounds, continue until it has healed.    . If you have any swelling, keep the area elevated.  . Some redness, tenderness and white or yellow material in the wound is normal healing.  If the area becomes very sore and red, or develops a thick yellow-green material (pus), it may be infected; please notify us.    . Wound healing continues for up to one year following surgery.  It is not unusual to experience pain in the scar from time to time during the interval.  If the pain becomes severe or the scar thickens, you should notify the office.  A slight amount of redness in a scar is expected for the first six months.  After six months, the redness subsides and the scar will soften and fade.  The color difference becomes less noticeable with time.  If there are any problems, return for a post-op surgery check at your earliest  convenience.  . It is important to wear sunscreen daily with an SPF of 30 or higher over the treated sites and to wear sun-protective clothing.  . Please call our office for any questions or concerns.  

## 2019-10-20 LAB — CMP14+EGFR
ALT: 15 IU/L (ref 0–32)
AST: 17 IU/L (ref 0–40)
Albumin/Globulin Ratio: 1.6 (ref 1.2–2.2)
Albumin: 4.1 g/dL (ref 3.8–4.8)
Alkaline Phosphatase: 85 IU/L (ref 48–121)
BUN/Creatinine Ratio: 18 (ref 12–28)
BUN: 14 mg/dL (ref 8–27)
Bilirubin Total: 0.5 mg/dL (ref 0.0–1.2)
CO2: 23 mmol/L (ref 20–29)
Calcium: 9.3 mg/dL (ref 8.7–10.3)
Chloride: 104 mmol/L (ref 96–106)
Creatinine, Ser: 0.77 mg/dL (ref 0.57–1.00)
GFR calc Af Amer: 92 mL/min/{1.73_m2} (ref >59)
GFR calc non Af Amer: 80 mL/min/{1.73_m2} (ref >59)
Globulin, Total: 2.5 g/dL (ref 1.5–4.5)
Glucose: 110 mg/dL — ABNORMAL HIGH (ref 65–99)
Potassium: 4 mmol/L (ref 3.5–5.2)
Sodium: 140 mmol/L (ref 134–144)
Total Protein: 6.6 g/dL (ref 6.0–8.5)

## 2019-10-20 LAB — LIPID PANEL
Chol/HDL Ratio: 4.7 ratio — ABNORMAL HIGH (ref 0.0–4.4)
Cholesterol, Total: 194 mg/dL (ref 100–199)
HDL: 41 mg/dL (ref >39)
LDL Chol Calc (NIH): 135 mg/dL — ABNORMAL HIGH (ref 0–99)
Triglycerides: 97 mg/dL (ref 0–149)
VLDL Cholesterol Cal: 18 mg/dL (ref 5–40)

## 2019-10-21 DIAGNOSIS — Z6834 Body mass index (BMI) 34.0-34.9, adult: Secondary | ICD-10-CM | POA: Insufficient documentation

## 2019-10-21 DIAGNOSIS — L918 Other hypertrophic disorders of the skin: Secondary | ICD-10-CM | POA: Insufficient documentation

## 2019-10-21 DIAGNOSIS — M25531 Pain in right wrist: Secondary | ICD-10-CM | POA: Insufficient documentation

## 2019-10-21 DIAGNOSIS — I110 Hypertensive heart disease with heart failure: Secondary | ICD-10-CM | POA: Insufficient documentation

## 2019-10-21 NOTE — Progress Notes (Signed)
This visit occurred during the SARS-CoV-2 public health emergency.  Safety protocols were in place, including screening questions prior to the visit, additional usage of staff PPE, and extensive cleaning of exam room while observing appropriate contact time as indicated for disinfecting solutions.  Subjective:     Patient ID: Summer Watkins , female    DOB: 03-17-1951 , 69 y.o.   MRN: [id number]   Chief Complaint  Patient presents with  . Hypertension  . Referrals    ortho and dermatology    HPI  She presents today for BP check. She reports compliance with meds. She denies chest pain, shortness of breath and palpitations.  She also wants referral to Ortho and Derm.   Hypertension This is a chronic problem. The current episode started more than 1 year ago. The problem has been gradually improving since onset. The problem is controlled. Pertinent negatives include no blurred vision, chest pain, palpitations or shortness of breath. Risk factors for coronary artery disease include obesity, post-menopausal state and sedentary lifestyle. Past treatments include beta blockers, diuretics and ACE inhibitors. The current treatment provides moderate improvement. Compliance problems include exercise.      Past Medical History:  Diagnosis Date  . Cardiomyopathy ([place])   . High blood pressure      Family History  Problem Relation Age of Onset  . Stroke Mother   . Hypertension Mother   . Healthy Father      Current Outpatient Medications:  .  aspirin EC 81 MG tablet, Take 81 mg by mouth., Disp: , Rfl:  .  carvedilol (COREG) 6.25 MG tablet, Take 6.25 mg by mouth., Disp: , Rfl:  .  diclofenac Sodium (VOLTAREN) 1 % GEL, Apply 4 g topically 4 (four) times daily., Disp: 150 g, Rfl: 3 .  hydrochlorothiazide (MICROZIDE) 12.5 MG capsule, Take by mouth., Disp: , Rfl:  .  ibuprofen (ADVIL) 800 MG tablet, Take 1 tablet (800 mg total) by mouth in the morning and at bedtime., Disp: 30 tablet, Rfl:  0 .  ramipril (ALTACE) 5 MG capsule, ramipril 5 mg capsule  TAKE 1 CAPSULE BY MOUTH EVERY DAY, Disp: , Rfl:  .  Vitamin D, Ergocalciferol, (DRISDOL) 1.25 MG (50000 UT) CAPS capsule, TAKE 1 CAPSULE BY MOUTH ONE TIME PER WEEK, Disp: 12 capsule, Rfl: 1 .  hydrOXYzine (ATARAX/VISTARIL) 25 MG tablet, Take by mouth., Disp: , Rfl:    No Known Allergies   Review of Systems  Constitutional: Negative.   Eyes: Negative for blurred vision.  Respiratory: Negative.  Negative for shortness of breath.   Cardiovascular: Negative.  Negative for chest pain and palpitations.  Gastrointestinal: Negative.   Musculoskeletal: Positive for arthralgias.       She c/o right wrist pain. States she had a fall last year. Seen by provider at [facility], she has had persistent pain with movement. She is not satisfied w/ her provider. Wears brace at work. Wants to see a new provider.  Skin:       She has mole on side she wants removed. She wants Derm referral   Neurological: Negative.   Psychiatric/Behavioral: Negative.      Today's Vitals   10/19/19 0840  BP: 132/86  Pulse: 78  Temp: 98.1 F (36.7 C)  TempSrc: Oral  Weight: 187 lb 12.8 oz (85.2 kg)  Height: '5\' 2"'  (1.575 m)  PainSc: 0-No pain   Body mass index is 34.35 kg/m.   Objective:  Physical Exam Vitals and nursing note reviewed.  Constitutional:  Appearance: Normal appearance. She is obese.  HENT:     Head: Normocephalic and atraumatic.  Cardiovascular:     Rate and Rhythm: Normal rate and regular rhythm.     Heart sounds: Normal heart sounds.  Pulmonary:     Effort: Pulmonary effort is normal.     Breath sounds: Normal breath sounds.  Skin:    General: Skin is warm.     Comments: Flesh-colored skin tag in right side, lateral to r breast, in mid axillary line.   Neurological:     General: No focal deficit present.     Mental Status: She is alert.  Psychiatric:        Mood and Affect: Mood normal.        Behavior: Behavior  normal.         Assessment And Plan:     1. 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 Pt advised her BP goal is less than 130/80. She is encouraged to incorporate more exercise into her daily routine. She is to aim for at least 30 minutes five days per week. I will check renal function today.   - CMP14+EGFR - Lipid panel  2. Dilated cardiomyopathy (HCC)  Chronic, yet stable. She is followed by [facility]. Her last echo was about 2 years ago, with EF 40-45%.  She is encouraged to follow a heart healthy diet.   3. Right wrist pain  Persistent. I will refer her to [facility] for further evaluation. She is not satisfied with the care of her previous provider.   - Ambulatory referral to Hand Surgery  4. Skin tag  After obtaining verbal consent, one skin tag was removed without complications. Area was cleaned and draped. Procedure performed using scalpel. Little bleeding afterwards, silver nitrate stick used to stop the bleeding. She was given post-procedural instructions.   5. Class 1 obesity due to excess calories with serious comorbidity and body mass index (BMI) of 34.0 to 34.9 in adult  She is encouraged to strive for BMI less than 30 to decrease cardiac risk. Again, encouraged to incorporate more exercise into her daily routine. Advised to work up to 27mnutes five days per week.    RMaximino Greenland MD    THE PATIENT IS ENCOURAGED TO PRACTICE SOCIAL DISTANCING DUE TO THE COVID-19 PANDEMIC.

## 2019-10-25 ENCOUNTER — Telehealth: Payer: Self-pay

## 2019-10-25 NOTE — Telephone Encounter (Signed)
I was calling the pt because Dr Allyne Gee wanted to know how the pt's skin tag is doing.

## 2019-11-08 DIAGNOSIS — M19031 Primary osteoarthritis, right wrist: Secondary | ICD-10-CM | POA: Insufficient documentation

## 2019-11-08 DIAGNOSIS — M25831 Other specified joint disorders, right wrist: Secondary | ICD-10-CM | POA: Insufficient documentation

## 2019-11-09 ENCOUNTER — Other Ambulatory Visit: Payer: Self-pay | Admitting: Orthopedic Surgery

## 2019-11-09 DIAGNOSIS — M25831 Other specified joint disorders, right wrist: Secondary | ICD-10-CM

## 2019-11-09 DIAGNOSIS — M19031 Primary osteoarthritis, right wrist: Secondary | ICD-10-CM

## 2019-11-09 DIAGNOSIS — M25531 Pain in right wrist: Secondary | ICD-10-CM

## 2019-11-19 ENCOUNTER — Other Ambulatory Visit: Payer: Self-pay | Admitting: Internal Medicine

## 2019-11-24 ENCOUNTER — Ambulatory Visit
Admission: RE | Admit: 2019-11-24 | Discharge: 2019-11-24 | Disposition: A | Discharge status: Home / Self Care | Payer: BC Managed Care – PPO | Source: Ambulatory Visit | Attending: Orthopedic Surgery | Admitting: Orthopedic Surgery

## 2019-11-24 DIAGNOSIS — M25831 Other specified joint disorders, right wrist: Secondary | ICD-10-CM

## 2019-11-24 DIAGNOSIS — M25531 Pain in right wrist: Secondary | ICD-10-CM

## 2019-11-24 DIAGNOSIS — M19031 Primary osteoarthritis, right wrist: Secondary | ICD-10-CM

## 2019-11-29 DIAGNOSIS — M1811 Unilateral primary osteoarthritis of first carpometacarpal joint, right hand: Secondary | ICD-10-CM | POA: Insufficient documentation

## 2020-01-01 NOTE — Progress Notes (Signed)
 Office Visit  PCP: CATHERYN LOISE SLOCUMB, MD  Reason for Visit:   Medical management of CHF  HPI:  Summer Watkins is a 69 y.o. female  Present for medical management of CHF. She has a history of congestive heart failure with normal coronaries by cath, hypertension and hyperlipidemia.  She was last seen in our office on 06/12/2019 by Dr. Toribio.  She has been doing well since her last office visit.  She denies any cardiac complaints today.  She denies chest pain, shortness of breath, dizziness, palpitations, syncope, and orthopnea.PND.   Her blood pressure is well controlled today.  This is been managed by her primary care.  Her cholesterol is not well controlled when it was checked last year.  Her LDL was 139.  She was started on Lipitor after that lab work.  She tells me today she was unable to tolerate the medication she developed joint pain and felt fatigued on it.  She stays fairly active at her job.  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  She is planning on retirement next year.  She met to bring her FMLA paperwork today she reports that Dr. Toribio feels this helps for her.  I have asked her to fax it and his nurse will have this filled out for her.  Assessment and plan:  1. Chronic systolic congestive heart failure (HCC) -Who was diagnosed in 2003 cardiac catheterization at that time demonstrated normal coronaries with an EF of 35 to 40%.  Echocardiogram in 2019 demonstrated improvement her LV function.  Her EF was 40 to 45%.  2. Essential hypertension, benign Her blood pressure acceptable today.  This is managed by the primary care.  3. Pure hypercholesterolemia LDL 139 based on labs from 12/12/2018.  She was initially started on Lipitor at that time.  She reports she has discontinued that medication due to joint pain and it made her feel fatigued.  I will plan to check her labs again today.  We have  discussed if her LDL remains elevated and I will plan to start Crestor  5 mg.     PLAN:  Overall she appears to be doing well from a cardiac standpoint.  I will plan to check her cholesterol today.  I will make no changes to her medical regimen and she will see Dr. Toribio in 1 year    Past Medical History: Past Medical History:  Diagnosis Date  . Cardiomyopathy (HCC)   . CHF (congestive heart failure) (HCC)   . Hypertension     Past Surgical History: Past Surgical History:  Procedure Laterality Date  . HYSTERECTOMY      Medications:   Current Outpatient Medications:  .  aspirin 81 MG chewable tablet *ANTIPLATELET*, Take by mouth., Disp: , Rfl:  .  atorvastatin (LIPITOR) 20 MG tablet, TAKE 1 TABLET BY MOUTH EVERY DAY, Disp: 90 tablet, Rfl: 3 .  carvediloL (COREG) 12.5 MG tablet, TAKE 1 TABLET BY MOUTH TWICE A DAY WITH FOOD, Disp: 180 tablet, Rfl: 3 .  diclofenac  (VOLTAREN ) 1 % Gel gel, Apply topically., Disp: , Rfl:  .  ergocalciferol  (VITAMIN D2) 50,000 unit capsule, TAKE ONE CAPSULE BY MOUTH TWICE A WEEK ON TUES/FRI, Disp: , Rfl: 2 .  hydroCHLOROthiazide (MICROZIDE) 12.5 mg capsule, TAKE 1 CAPSULE BY MOUTH EVERY DAY, Disp: 90 capsule, Rfl: 3 .  meloxicam (MOBIC) 7.5 MG tablet, Take 1 tablet (7.5 mg total)  by mouth daily. Take with food, Disp: 30 tablet, Rfl: 0 .  meloxicam (MOBIC) 7.5 MG tablet, Take 1 tablet (7.5 mg total) by mouth daily. Take with food, Disp: 30 tablet, Rfl: 0 .  ramipriL (ALTACE) 5 MG capsule, TAKE 1 CAPSULE BY MOUTH EVERY DAY, Disp: 90 capsule, Rfl: 3  Allergies: No Known Allergies  Social History: Social History   Tobacco Use  Smoking Status Never Smoker  Smokeless Tobacco Never Used   Social History   Substance and Sexual Activity  Alcohol Use Not Currently   Social History   Substance and Sexual Activity  Drug Use Not on file    Family History: Family History  Problem Relation Age of Onset  . Hypertension Mother     Review of  Systems:  CONSTITUTIONAL:  As per HPI. HEENT:  Eyes:  No diplopia or blurred vision. ENT:  No earache, sore throat or runny nose. CARDIOVASCULAR:  No pressure, squeezing, strangling, tightness, heaviness or aching about the chest, neck, axilla or epigastrium. RESPIRATORY:  No cough, shortness of breath. No PND or orthopnea. GASTROINTESTINAL:  No nausea, vomiting or diarrhea. GENITOURINARY:  No dysuria, frequency or urgency. MUSCULOSKELETAL:  As per HPI. SKIN:  No change in skin, hair or nails. NEUROLOGIC:  No paresthesias, fasciculations, seizures or weakness. PSYCHIATRIC:  No disorder of thought or mood. ENDOCRINE:  No heat or cold intolerance, polyuria or polydipsia. HEMATOLOGICAL:  No easy bruising or bleeding.    VITAL SIGNS:  Vitals:   01/01/20 0907  BP: 140/88  Pulse: 76 Comment: Reg  Height: 1.575 m (5' 2)  Weight: 85.3 kg (188 lb)  BMI (Calculated): 34.5      Physical Exam: GENERAL: Pleasant female sitting on exam table, appears well. NAD. A+O X 3  HEENT: Normocephalic, Anicteric.  NECK: supple, Trachea midline, Carotid upstrokes normal No JVD  RESPIRATORY: Good breath sound.  No wheezing or Rhonchi heard COR: Normal precordium regular rhythm. Normal S1 and S2.  No murmur/rub, or gallops heard.  ABDOMEN: + BS, Soft, Non tender.  VASCULAR: Normal radial,  posterior tibial pulses bilaterally  EXTREMITIES: No cyanosis or clubbing. No edema of lower extremities.  NEURO: normal mental status, no gross focal motor deficit.  SKIN no rash  MUSCLE -SKELETAL :  full range of motion PSYCH: normal behavior, appropriate. Test Results none  Labs:  Sodium  Date/Time Value Ref Range Status  05/03/2018 09:01 AM 139 135 - 146 MMOL/L Final   Potassium  Date/Time Value Ref Range Status  05/03/2018 09:01 AM 4.0 3.5 - 5.3 MMOL/L Final   Magnesium  Date/Time Value Ref Range Status  05/03/2018 09:01 AM 2.0 1.8 - 2.4 MG/DL Final   BUN  Date/Time Value Ref Range Status   05/03/2018 09:01 AM 17 8 - 24 MG/DL Final  94/92/7981 90:50 AM 20 6.0 - 23.0 mg/dL    Creatinine  Date/Time Value Ref Range Status  05/03/2018 09:01 AM 0.71 0.50 - 1.50 MG/DL Final   Total Cholesterol  Date/Time Value Ref Range Status  12/12/2018 11:22 AM 201 (H) 25 - 199 MG/DL Final   Triglycerides  Date/Time Value Ref Range Status  12/12/2018 11:22 AM 82 10 - 150 MG/DL Final   HDL Cholesterol  Date/Time Value Ref Range Status  12/12/2018 11:22 AM 46 35 - 135 MG/DL Final   LDL Cholesterol Calculated  Date/Time Value Ref Range Status  12/12/2018 11:22 AM 139 MG/DL Final    Comment:    ATP III Classification (LDL):  <100  mg/dL     Optimal  899 - 870  mg/dL     Near or Above Optimal  130 - 159  mg/dL     Borderline High  839-810   mg/dL     High  >809    mg/dL     Very High   ATP III Classification (LDL):    < 100    mg/dL   Optimal   899 - 870  mg/dL   Near or Above Optimal   130 - 159  mg/dL   Borderline High   839 - 189  mg/dL   High    > 809    mg/dL   Very High    Hemoglobin  Date/Time Value Ref Range Status  05/03/2018 09:01 AM 12.6 12.0 - 15.0 G/DL Final   Hematocrit  Date/Time Value Ref Range Status  05/03/2018 09:01 AM 38.5 36.0 - 46.0 % Final   Platelets  Date/Time Value Ref Range Status  05/03/2018 09:01 AM 275 150 - 400 X 10*3/uL Final   No results found for: INR, PROTIME   Electronically signed by: Leotis Julian Hummer, NP 01/01/2020 9:04 AM,  01/01/2020, 9:04 AM    Electronically signed by: Leotis Julian Sella, NP 01/01/20 0940

## 2020-02-24 ENCOUNTER — Other Ambulatory Visit: Payer: Self-pay | Admitting: Internal Medicine

## 2020-04-24 ENCOUNTER — Ambulatory Visit (INDEPENDENT_AMBULATORY_CARE_PROVIDER_SITE_OTHER): Payer: BC Managed Care – PPO | Admitting: Internal Medicine

## 2020-04-24 ENCOUNTER — Encounter: Payer: Self-pay | Admitting: Internal Medicine

## 2020-04-24 ENCOUNTER — Other Ambulatory Visit: Payer: Self-pay

## 2020-04-24 VITALS — BP 114/62 | HR 73 | Temp 98.8°F | Ht 62.0 in | Wt 183.4 lb

## 2020-04-24 DIAGNOSIS — I110 Hypertensive heart disease with heart failure: Secondary | ICD-10-CM

## 2020-04-24 DIAGNOSIS — R7309 Other abnormal glucose: Secondary | ICD-10-CM | POA: Diagnosis not present

## 2020-04-24 DIAGNOSIS — E7800 Pure hypercholesterolemia, unspecified: Secondary | ICD-10-CM

## 2020-04-24 DIAGNOSIS — Z0000 Encounter for general adult medical examination without abnormal findings: Secondary | ICD-10-CM | POA: Diagnosis not present

## 2020-04-24 DIAGNOSIS — I5022 Chronic systolic (congestive) heart failure: Secondary | ICD-10-CM

## 2020-04-24 DIAGNOSIS — E6609 Other obesity due to excess calories: Secondary | ICD-10-CM

## 2020-04-24 DIAGNOSIS — E559 Vitamin D deficiency, unspecified: Secondary | ICD-10-CM

## 2020-04-24 DIAGNOSIS — Z6833 Body mass index (BMI) 33.0-33.9, adult: Secondary | ICD-10-CM

## 2020-04-24 LAB — POCT URINALYSIS DIPSTICK
Bilirubin, UA: NEGATIVE
Blood, UA: NEGATIVE
Glucose, UA: NEGATIVE
Ketones, UA: NEGATIVE
Leukocytes, UA: NEGATIVE
Nitrite, UA: NEGATIVE
Protein, UA: POSITIVE — AB
Spec Grav, UA: >=1.03 — AB (ref 1.010–1.025)
Urobilinogen, UA: 1 E.U./dL
pH, UA: 5.5 (ref 5.0–8.0)

## 2020-04-24 LAB — POCT UA - MICROALBUMIN
Albumin/Creatinine Ratio, Urine, POC: <30
Creatinine, POC: 300 mg/dL
Microalbumin Ur, POC: 30 mg/L

## 2020-04-24 MED ORDER — ROSUVASTATIN CALCIUM 20 MG PO TABS: ORAL_TABLET | ORAL | 1 refills | Status: DC

## 2020-04-24 NOTE — Patient Instructions (Signed)
Health Maintenance After Age 68 After age 68, you are at a higher risk for certain long-term diseases and infections as well as injuries from falls. Falls are a major cause of broken bones and head injuries in people who are older than age 68. Getting regular preventive care can help to keep you healthy and well. Preventive care includes getting regular testing and making lifestyle changes as recommended by your health care provider. Talk with your health care provider about:  Which screenings and tests you should have. A screening is a test that checks for a disease when you have no symptoms.  A diet and exercise plan that is right for you. What should I know about screenings and tests to prevent falls? Screening and testing are the best ways to find a health problem early. Early diagnosis and treatment give you the best chance of managing medical conditions that are common after age 68. Certain conditions and lifestyle choices may make you more likely to have a fall. Your health care provider may recommend:  Regular vision checks. Poor vision and conditions such as cataracts can make you more likely to have a fall. If you wear glasses, make sure to get your prescription updated if your vision changes.  Medicine review. Work with your health care provider to regularly review all of the medicines you are taking, including over-the-counter medicines. Ask your health care provider about any side effects that may make you more likely to have a fall. Tell your health care provider if any medicines that you take make you feel dizzy or sleepy.  Osteoporosis screening. Osteoporosis is a condition that causes the bones to get weaker. This can make the bones weak and cause them to break more easily.  Blood pressure screening. Blood pressure changes and medicines to control blood pressure can make you feel dizzy.  Strength and balance checks. Your health care provider may recommend certain tests to check your  strength and balance while standing, walking, or changing positions.  Foot health exam. Foot pain and numbness, as well as not wearing proper footwear, can make you more likely to have a fall.  Depression screening. You may be more likely to have a fall if you have a fear of falling, feel emotionally low, or feel unable to do activities that you used to do.  Alcohol use screening. Using too much alcohol can affect your balance and may make you more likely to have a fall. What actions can I take to lower my risk of falls? General instructions  Talk with your health care provider about your risks for falling. Tell your health care provider if: ? You fall. Be sure to tell your health care provider about all falls, even ones that seem minor. ? You feel dizzy, sleepy, or off-balance.  Take over-the-counter and prescription medicines only as told by your health care provider. These include any supplements.  Eat a healthy diet and maintain a healthy weight. A healthy diet includes low-fat dairy products, low-fat (lean) meats, and fiber from whole grains, beans, and lots of fruits and vegetables. Home safety  Remove any tripping hazards, such as rugs, cords, and clutter.  Install safety equipment such as grab bars in bathrooms and safety rails on stairs.  Keep rooms and walkways well-lit. Activity   Follow a regular exercise program to stay fit. This will help you maintain your balance. Ask your health care provider what types of exercise are appropriate for you.  If you need a cane or   walker, use it as recommended by your health care provider.  Wear supportive shoes that have nonskid soles. Lifestyle  Do not drink alcohol if your health care provider tells you not to drink.  If you drink alcohol, limit how much you have: ? 0-1 drink a day for women. ? 0-2 drinks a day for men.  Be aware of how much alcohol is in your drink. In the [place] one drink equals one typical bottle of beer (12  oz), one-half glass of wine (5 oz), or one shot of hard liquor (1 oz).  Do not use any products that contain nicotine or tobacco, such as cigarettes and e-cigarettes. If you need help quitting, ask your health care provider. Summary  Having a healthy lifestyle and getting preventive care can help to protect your health and wellness after age 68.  Screening and testing are the best way to find a health problem early and help you avoid having a fall. Early diagnosis and treatment give you the best chance for managing medical conditions that are more common for people who are older than age 68.  Falls are a major cause of broken bones and head injuries in people who are older than age 68. Take precautions to prevent a fall at home.  Work with your health care provider to learn what changes you can make to improve your health and wellness and to prevent falls. This information is not intended to replace advice given to you by your health care provider. Make sure you discuss any questions you have with your health care provider. Document Revised: 09/08/2018 Document Reviewed: 03/31/2017 Elsevier Patient Education  2020 [employer]  

## 2020-04-24 NOTE — Progress Notes (Signed)
I,Katawbba Wiggins,acting as a Education administrator for Maximino Greenland, MD.,have documented all relevant documentation on the behalf of Maximino Greenland, MD,as directed by  Maximino Greenland, MD while in the presence of Maximino Greenland, MD.  This visit occurred during the SARS-CoV-2 public health emergency.  Safety protocols were in place, including screening questions prior to the visit, additional usage of staff PPE, and extensive cleaning of exam room while observing appropriate contact time as indicated for disinfecting solutions.  Subjective:     Patient ID: Summer Watkins , female    DOB: 20-Feb-1951 , 69 y.o.   MRN: [id number]   Chief Complaint  Patient presents with   Annual Exam   Hypertension   Hyperlipidemia    HPI  She is here today for a full physical examination. She is followed by GYN, Dr. Corinna Capra, for her pelvic exams. She is due to see him in January 2022. She has no specific concerns at this time. She states she plans to retire next year.   Hypertension This is a chronic problem. The current episode started more than 1 year ago. The problem has been gradually improving since onset. The problem is controlled. Pertinent negatives include no blurred vision, chest pain, palpitations or shortness of breath. Hypertensive end-organ damage includes heart failure.     Past Medical History:  Diagnosis Date   Cardiomyopathy ([place])    High blood pressure      Family History  Problem Relation Age of Onset   Stroke Mother    Hypertension Mother    Healthy Father      Current Outpatient Medications:    aspirin EC 81 MG tablet, Take 81 mg by mouth., Disp: , Rfl:    carvedilol (COREG) 6.25 MG tablet, Take 6.25 mg by mouth., Disp: , Rfl:    diclofenac Sodium (VOLTAREN) 1 % GEL, Apply 4 g topically 4 (four) times daily., Disp: 150 g, Rfl: 3   hydrochlorothiazide (MICROZIDE) 12.5 MG capsule, Take by mouth., Disp: , Rfl:    hydrOXYzine (ATARAX/VISTARIL) 25 MG tablet, Take by  mouth., Disp: , Rfl:    ibuprofen (ADVIL) 800 MG tablet, Take 1 tablet (800 mg total) by mouth in the morning and at bedtime., Disp: 30 tablet, Rfl: 0   ramipril (ALTACE) 5 MG capsule, ramipril 5 mg capsule  TAKE 1 CAPSULE BY MOUTH EVERY DAY, Disp: , Rfl:    Vitamin D, Ergocalciferol, (DRISDOL) 1.25 MG (50000 UNIT) CAPS capsule, TAKE 1 CAPSULE BY MOUTH ONE TIME PER WEEK, Disp: 12 capsule, Rfl: 1   rosuvastatin (CRESTOR) 20 MG tablet, One tab po on Monday, Wednesday, Friday, Disp: 45 tablet, Rfl: 1   No Known Allergies    The patient states she uses Ortho-Evra patches weekly and post menopausal status for birth control. Last LMP was No LMP recorded. Patient has had a hysterectomy.. Negative for Dysmenorrhea. Negative for: breast discharge, breast lump(s), breast pain and breast self exam. Associated symptoms include abnormal vaginal bleeding. Pertinent negatives include abnormal bleeding (hematology), anxiety, decreased libido, depression, difficulty falling sleep, dyspareunia, history of infertility, nocturia, sexual dysfunction, sleep disturbances, urinary incontinence, urinary urgency, vaginal discharge and vaginal itching. Diet regular.The patient states her exercise level is  intermittent.  . The patient's tobacco use is:  Social History   Tobacco Use  Smoking Status Never Smoker  Smokeless Tobacco Never Used  . She has been exposed to passive smoke. The patient's alcohol use is:  Social History   Substance and Sexual Activity  Alcohol  Use Never    Review of Systems  Constitutional: Negative.   HENT: Negative.   Eyes: Negative.  Negative for blurred vision.  Respiratory: Negative.  Negative for shortness of breath.   Cardiovascular: Negative.  Negative for chest pain and palpitations.  Gastrointestinal: Negative.   Endocrine: Negative.   Genitourinary: Negative.   Musculoskeletal: Negative.   Skin: Negative.   Allergic/Immunologic: Negative.   Neurological: Negative.    Hematological: Negative.   Psychiatric/Behavioral: Negative.      Today's Vitals   04/24/20 0858  BP: 114/62  Pulse: 73  Temp: 98.8 F (37.1 C)  TempSrc: Oral  Weight: 183 lb 6.4 oz (83.2 kg)  Height: '5\' 2"'  (1.575 m)   Body mass index is 33.54 kg/m.  Wt Readings from Last 3 Encounters:  04/24/20 183 lb 6.4 oz (83.2 kg)  10/19/19 187 lb 12.8 oz (85.2 kg)  04/19/19 186 lb 3.2 oz (84.5 kg)   Objective:  Physical Exam Constitutional:      General: She is not in acute distress.    Appearance: Normal appearance. She is well-developed. She is obese.  HENT:     Head: Normocephalic and atraumatic.     Right Ear: Hearing, tympanic membrane, ear canal and external ear normal. There is no impacted cerumen.     Left Ear: Hearing, tympanic membrane, ear canal and external ear normal. There is no impacted cerumen.     Nose:     Comments: Deferred, masked    Mouth/Throat:     Comments: Deferred, masked Eyes:     General: Lids are normal.     Extraocular Movements: Extraocular movements intact.     Conjunctiva/sclera: Conjunctivae normal.     Pupils: Pupils are equal, round, and reactive to light.     Funduscopic exam:    Right eye: No papilledema.        Left eye: No papilledema.  Neck:     Thyroid: No thyroid mass.     Vascular: No carotid bruit.  Cardiovascular:     Rate and Rhythm: Normal rate and regular rhythm.     Pulses: Normal pulses.     Heart sounds: Normal heart sounds. No murmur heard.   Pulmonary:     Effort: Pulmonary effort is normal.     Breath sounds: Normal breath sounds.  Chest:     Breasts: Tanner Score is 5.        Right: Normal.        Left: Normal.  Abdominal:     General: Bowel sounds are normal. There is no distension.     Palpations: Abdomen is soft.     Tenderness: There is no abdominal tenderness.  Musculoskeletal:        General: No swelling. Normal range of motion.     Cervical back: Full passive range of motion without pain, normal range  of motion and neck supple.     Right lower leg: No edema.     Left lower leg: No edema.  Skin:    General: Skin is warm and dry.     Capillary Refill: Capillary refill takes less than 2 seconds.  Neurological:     General: No focal deficit present.     Mental Status: She is alert and oriented to person, place, and time.     Cranial Nerves: No cranial nerve deficit.     Sensory: No sensory deficit.  Psychiatric:        Mood and Affect: Mood normal.  Behavior: Behavior normal.        Thought Content: Thought content normal.        Judgment: Judgment normal.         Assessment And Plan:     1. Routine general medical examination at health care facility Comments: A full exam was performed.  Importance of monthly self breast exams was discussed with the patient.  PATIENT IS ADVISED TO GET 30-45 MINUTES REGULAR EXERCISE NO LESS THAN FOUR TO FIVE DAYS PER WEEK - BOTH WEIGHTBEARING EXERCISES AND AEROBIC ARE RECOMMENDED.  PATIENT IS ADVISED TO FOLLOW A HEALTHY DIET WITH AT LEAST SIX FRUITS/VEGGIES PER DAY, DECREASE INTAKE OF RED MEAT, AND TO INCREASE FISH INTAKE TO TWO DAYS PER WEEK.  MEATS/FISH SHOULD NOT BE FRIED, BAKED OR BROILED IS PREFERABLE.  I SUGGEST WEARING SPF 50 SUNSCREEN ON EXPOSED PARTS AND ESPECIALLY WHEN IN THE DIRECT SUNLIGHT FOR AN EXTENDED PERIOD OF TIME.  PLEASE AVOID FAST FOOD RESTAURANTS AND INCREASE YOUR WATER INTAKE.   2. Hypertensive heart disease with chronic systolic congestive heart failure ([place]) Comments: Chronic, well controlled. She will continue with current meds. EKG performed, NSR w/ LAE, low voltage, diffuse nonspecific T abnormality. No new changes noted. She will rto in six months for re-evaluation.   - POCT Urinalysis Dipstick (81002) - POCT UA - Microalbumin - EKG 12-Lead - CBC - CMP14+EGFR  3. Pure hypercholesterolemia  Chronic. She has been tried on atorvastatin before, but she stopped it due to myalgias. She agrees to try rosuvastatin 32m w/  MWF dosing. She will rto in six weeks for re-evaluation. I will send a copy of her labs to her cardiologist, Dr. KHunt Orisat that time.   4. Other abnormal glucose  HER A1C HAS BEEN ELEVATED IN THE PAST. I WILL CHECK AN A1C, BMET TODAY. SHE WAS ENCOURAGED TO AVOID SUGARY BEVERAGES AND PROCESSED FOODS INCLUDNG BREADS, RICE AND PASTA.  - Hemoglobin A1c  5. Class 1 obesity due to excess calories with serious comorbidity and body mass index (BMI) of 33.0 to 33.9 in adult  She is encouraged to strive for BMI less than 30 to decrease cardiac risk. Advised to aim for at least 150 minutes of exercise per week.  6. Vitamin D deficiency disease  I will check vitamin D level and supplement as needed.   - Vitamin D (25 hydroxy)     Patient was given opportunity to ask questions. Patient verbalized understanding of the plan and was able to repeat key elements of the plan. All questions were answered to their satisfaction.   RMaximino Greenland MD   I, RMaximino Greenland MD, have reviewed all documentation for this visit. The documentation on 04/24/20 for the exam, diagnosis, procedures, and orders are all accurate and complete.  THE PATIENT IS ENCOURAGED TO PRACTICE SOCIAL DISTANCING DUE TO THE COVID-19 PANDEMIC.

## 2020-04-25 LAB — CMP14+EGFR
ALT: 16 IU/L (ref 0–32)
AST: 15 IU/L (ref 0–40)
Albumin/Globulin Ratio: 1.6 (ref 1.2–2.2)
Albumin: 4.1 g/dL (ref 3.8–4.8)
Alkaline Phosphatase: 96 IU/L (ref 44–121)
BUN/Creatinine Ratio: 20 (ref 12–28)
BUN: 15 mg/dL (ref 8–27)
Bilirubin Total: 0.5 mg/dL (ref 0.0–1.2)
CO2: 24 mmol/L (ref 20–29)
Calcium: 9.9 mg/dL (ref 8.7–10.3)
Chloride: 104 mmol/L (ref 96–106)
Creatinine, Ser: 0.75 mg/dL (ref 0.57–1.00)
GFR calc Af Amer: 95 mL/min/{1.73_m2} (ref >59)
GFR calc non Af Amer: 82 mL/min/{1.73_m2} (ref >59)
Globulin, Total: 2.6 g/dL (ref 1.5–4.5)
Glucose: 110 mg/dL — ABNORMAL HIGH (ref 65–99)
Potassium: 4.3 mmol/L (ref 3.5–5.2)
Sodium: 143 mmol/L (ref 134–144)
Total Protein: 6.7 g/dL (ref 6.0–8.5)

## 2020-04-25 LAB — CBC
Hematocrit: 36.6 % (ref 34.0–46.6)
Hemoglobin: 12.6 g/dL (ref 11.1–15.9)
MCH: 28 pg (ref 26.6–33.0)
MCHC: 34.4 g/dL (ref 31.5–35.7)
MCV: 81 fL (ref 79–97)
Platelets: 285 10*3/uL (ref 150–450)
RBC: 4.5 x10E6/uL (ref 3.77–5.28)
RDW: 12.8 % (ref 11.7–15.4)
WBC: 6.2 10*3/uL (ref 3.4–10.8)

## 2020-04-25 LAB — HEMOGLOBIN A1C
Est. average glucose Bld gHb Est-mCnc: 114 mg/dL
Hgb A1c MFr Bld: 5.6 % (ref 4.8–5.6)

## 2020-04-25 LAB — VITAMIN D 25 HYDROXY (VIT D DEFICIENCY, FRACTURES): Vit D, 25-Hydroxy: 60 ng/mL (ref 30.0–100.0)

## 2020-06-06 ENCOUNTER — Ambulatory Visit: Payer: BC Managed Care – PPO | Admitting: Sports Medicine

## 2020-06-06 ENCOUNTER — Encounter: Payer: Self-pay | Admitting: Sports Medicine

## 2020-06-06 ENCOUNTER — Other Ambulatory Visit: Payer: Self-pay

## 2020-06-06 DIAGNOSIS — M79671 Pain in right foot: Secondary | ICD-10-CM

## 2020-06-06 DIAGNOSIS — L299 Pruritus, unspecified: Secondary | ICD-10-CM

## 2020-06-06 DIAGNOSIS — M19079 Primary osteoarthritis, unspecified ankle and foot: Secondary | ICD-10-CM

## 2020-06-06 DIAGNOSIS — B359 Dermatophytosis, unspecified: Secondary | ICD-10-CM | POA: Diagnosis not present

## 2020-06-06 DIAGNOSIS — K5900 Constipation, unspecified: Secondary | ICD-10-CM | POA: Insufficient documentation

## 2020-06-06 DIAGNOSIS — M79674 Pain in right toe(s): Secondary | ICD-10-CM | POA: Diagnosis not present

## 2020-06-06 DIAGNOSIS — M779 Enthesopathy, unspecified: Secondary | ICD-10-CM

## 2020-06-06 DIAGNOSIS — M79675 Pain in left toe(s): Secondary | ICD-10-CM

## 2020-06-06 DIAGNOSIS — Z1211 Encounter for screening for malignant neoplasm of colon: Secondary | ICD-10-CM | POA: Insufficient documentation

## 2020-06-06 DIAGNOSIS — B351 Tinea unguium: Secondary | ICD-10-CM

## 2020-06-06 MED ORDER — CLOTRIMAZOLE 1 % EX SOLN: 1.0000 "application " | Freq: Two times a day (BID) | CUTANEOUS | 5 refills | Status: DC

## 2020-06-06 MED ORDER — CLOTRIMAZOLE-BETAMETHASONE 1-0.05 % EX CREA: 1.0000 "application " | TOPICAL_CREAM | Freq: Two times a day (BID) | CUTANEOUS | 0 refills | Status: DC

## 2020-06-06 MED ORDER — HYDROCORTISONE 2.5 % EX CREA: TOPICAL_CREAM | Freq: Two times a day (BID) | CUTANEOUS | 0 refills | Status: DC

## 2020-06-06 MED ORDER — IBUPROFEN 800 MG PO TABS: 800.0000 mg | ORAL_TABLET | Freq: Two times a day (BID) | ORAL | 0 refills | Status: DC

## 2020-06-06 NOTE — Patient Instructions (Signed)
Vinegar soaks 1 cup of white distilled vinegar or ACV to 8 cups of warm water.  Soak 20 mins. May repeat soak two times per week.  If there is thickness to nails may file nails after soaks or after bath/shower with nail file and apply tea tree oil. Apply oil daily to nails after filing for the best result.

## 2020-06-06 NOTE — Progress Notes (Signed)
Subjective: Summer Watkins is a 70 y.o. female patient who returns office for a new problem of itching to the toes reports that she borrowed her daughter's hydrocortisone and clotrimazole cream which seemed to help.  Patient reports that her arthritis at the top of the right foot is doing good does not really bother her that often.  Patient denies any other pedal complaints at this time.  Patient Active Problem List   Diagnosis Date Noted  . Colon cancer screening 06/06/2020  . Constipation 06/06/2020  . Primary osteoarthritis of first carpometacarpal joint of right hand 11/29/2019  . Arthritis of right wrist 11/08/2019  . 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 Hypertensive heart disease with chronic systolic congestive heart failure (HCC) 10/21/2019  . Right wrist pain 10/21/2019  . Skin tag 10/21/2019  . Class 1 obesity due to excess calories with serious comorbidity and body mass index (BMI) of 34.0 to 34.9 in adult 10/21/2019  . Hyperlipidemia 06/12/2019  . Heart disease 06/12/2019  . Pure hypercholesterolemia 06/12/2019  . Idiopathic cardiomyopathy (HCC) 04/13/2017  . Benign essential hypertension 09/23/2015  . Chronic systolic (congestive) heart failure (HCC) 09/23/2015  . Primary idiopathic dilated cardiomyopathy (HCC) 09/23/2015  . Porokeratosis 11/16/2012  . Pain in joint, ankle and foot 11/16/2012    Current Outpatient Medications on File Prior to Visit  Medication Sig Dispense Refill  . aspirin EC 81 MG tablet Take 81 mg by mouth.    . carvedilol (COREG) 6.25 MG tablet Take 6.25 mg by mouth.    . diclofenac Sodium (VOLTAREN) 1 % GEL Apply 4 g topically 4 (four) times daily. 150 g 3  . hydrochlorothiazide (MICROZIDE) 12.5 MG capsule Take by mouth.    . hydrocortisone 2.5 % cream Apply topically.    . hydrOXYzine (ATARAX/VISTARIL) 25 MG tablet Take by mouth.    . meloxicam (MOBIC) 7.5 MG tablet meloxicam 7.5 mg tablet  TAKE 1 TABLET (7.5 MG TOTAL) BY MOUTH  DAILY. TAKE WITH FOOD    . nitrofurantoin, macrocrystal-monohydrate, (MACROBID) 100 MG capsule nitrofurantoin monohydrate/macrocrystals 100 mg capsule  TAKE 1 CAPSULE BY MOUTH EVERY 12 HOURS FOR 7 DAYS    . ramipril (ALTACE) 5 MG capsule ramipril 5 mg capsule  TAKE 1 CAPSULE BY MOUTH EVERY DAY    . rosuvastatin (CRESTOR) 20 MG tablet One tab po on Monday, Wednesday, Friday 45 tablet 1  . sulfamethoxazole-trimethoprim (BACTRIM DS) 800-160 MG tablet sulfamethoxazole 800 mg-trimethoprim 160 mg tablet  TAKE 1 TABLET BY MOUTH TWICE A DAY 3FD    . Vitamin D, Ergocalciferol, (DRISDOL) 1.25 MG (50000 UNIT) CAPS capsule TAKE 1 CAPSULE BY MOUTH ONE TIME PER WEEK 12 capsule 1   No current facility-administered medications on file prior to visit.    No Known Allergies  Objective:  General: Alert and oriented x3 in no acute distress  Dermatology: Dry scaly skin to be plantar surfaces of both feet and to the interspaces bilateral, no webspace macerations, no ecchymosis bilateral, all nails x 10 are short thickened and dystrophic with significant discoloration and subungual debris consistent with onychomycosis.  Vascular: Dorsalis Pedis and Posterior Tibial pedal pulses 1/4, Capillary Fill Time 3 seconds, + pedal hair growth bilateral, no edema bilateral lower extremities, Temperature gradient within normal limits.  Neurology: Michaell Cowing sensation intact via light touch bilateral.  Musculoskeletal: No tenderness to right midfoot at area of dorsal bone spur.   Assessment and Plan: Problem List Items Addressed This Visit   None   Visit Diagnoses  Tinea    -  Primary   Relevant Medications   nitrofurantoin, macrocrystal-monohydrate, (MACROBID) 100 MG capsule   sulfamethoxazole-trimethoprim (BACTRIM DS) 800-160 MG tablet   clotrimazole-betamethasone (LOTRISONE) cream   clotrimazole (LOTRIMIN) 1 % external solution   Itching       Nail fungus       Relevant Medications   nitrofurantoin,  macrocrystal-monohydrate, (MACROBID) 100 MG capsule   sulfamethoxazole-trimethoprim (BACTRIM DS) 800-160 MG tablet   clotrimazole-betamethasone (LOTRISONE) cream   clotrimazole (LOTRIMIN) 1 % external solution   Toe pain, bilateral       Right foot pain       Resolved   Arthritis of foot       Relevant Medications   meloxicam (MOBIC) 7.5 MG tablet   ibuprofen (ADVIL) 800 MG tablet   Capsulitis          -Complete examination performed -Discussed treatment options -Rx Clotrimazole solution for nails and to use in between toes -Rx Lotrisone and hydrocortsone to use tops and bottoms of both feet -Advised good hygiene habits -Recommend lysol spray to use as instructed -May try vinegar soaks as instructed  -Advised good supportive shoes and inserts for foot type -Refill Motrin to take as needed for pain related to arthritis at right foot -Patient to return to office as needed or sooner if condition worsens.  Asencion Islam, DPM

## 2020-06-19 ENCOUNTER — Encounter: Payer: BC Managed Care – PPO | Admitting: Internal Medicine

## 2020-06-19 ENCOUNTER — Encounter: Payer: Self-pay | Admitting: Internal Medicine

## 2020-06-19 ENCOUNTER — Other Ambulatory Visit: Payer: Self-pay

## 2020-06-19 NOTE — Patient Instructions (Signed)

## 2020-06-19 NOTE — Progress Notes (Signed)
Pt was checked in and triaged for appt. She left prior to being seen.

## 2020-06-20 ENCOUNTER — Ambulatory Visit: Payer: BC Managed Care – PPO | Admitting: Internal Medicine

## 2020-06-24 ENCOUNTER — Telehealth: Payer: Self-pay

## 2020-06-24 NOTE — Telephone Encounter (Signed)
The pt wants to know if she can get a prescription for the other pneumonia vaccination that she needs.  The pt said she wanted to go get from the pharmacy before her insurance runs out on Friday.  The pt has only had 1 pneumonia vaccination the 23 dated 04/19/2019 at the office and she said she was told she would need another one but not sure of which one she needed.

## 2020-06-26 ENCOUNTER — Other Ambulatory Visit: Payer: Self-pay | Admitting: Internal Medicine

## 2020-06-26 MED ORDER — PNEUMOCOCCAL 13-VAL CONJ VACC IM SUSP: 0.5000 mL | INTRAMUSCULAR | 0 refills | Status: AC

## 2020-06-29 ENCOUNTER — Other Ambulatory Visit: Payer: Self-pay | Admitting: Sports Medicine

## 2020-07-02 NOTE — Telephone Encounter (Signed)
Please advise 

## 2020-08-02 NOTE — Progress Notes (Signed)
 Subjective:     Patient ID:  Summer Watkins is a 70 y.o. female who presents today for Establish Care (Pt states she had issues at her last office.) and Medication Management (Pt states she is fasting and isn't sure which cholesterol med she is supposed to be taking. )  Summer Watkins presents today for management of chronic medical problems that are all stable. Reports taking medications as prescribed/directed and feels well today. She reports last mammogram was a few weeks ago and was normal. Last colonoscopy 3 years ago and normal. Have sent HM request form to have scanned copies of reports.a  Hyperlipidemia Presents in follow up of her cholesterol. Current lipid lowering medications reviewed -- had both Lipitor and Crestor  at home, unsure which to take but has been taking Crestor . Tolerating medication well. No side effects. No myalgias. Lipid panel is current. Lab Results  Component Value Date   CHOL 209 (H) 01/01/2020   TRIG 99 01/01/2020   HDL 43 01/01/2020   LDL 146 (H) 01/01/2020   Hypertension Patient is here for follow-up of elevated blood pressure. She is taking medication as prescribed and feels well today. Denies side effects from the medication. Patient denies chest pain, dyspnea, exertional chest pressure/discomfort, palpitations, syncope and worsening edema.  BP Readings from Last 3 Encounters:  08/02/20 128/72  06/24/20 140/84  01/01/20 140/88   CHF, idiopathic cardiomyopathy Patient presents for re-evaluation of congestive heart failure. She is followed by Cardiology. Patient's current complaints are none. She denies chest pain, chest pressure/discomfort, claudication, dyspnea, exertional chest pressure/discomfort, fatigue, irregular heart beat, lower extremity edema and orthopnea. She states she is compliant all of the time with her medications. She states she is compliant most of the time with her diet.  The following portions of the patient's history were  reviewed and updated as appropriate: Patient Active Problem List  Diagnosis  . Chronic systolic congestive heart failure (HCC)  . Essential hypertension, benign  . Pure hypercholesterolemia  . Ulnocarpal abutment syndrome, right  . Right wrist pain  . Arthritis of right wrist  . Primary osteoarthritis of first carpometacarpal joint of right hand  . Hyperlipidemia  . Obesity  . Porokeratosis  . Primary idiopathic dilated cardiomyopathy (HCC)    Current Outpatient Medications on File Prior to Visit  Medication Sig Dispense Refill  . aspirin 81 MG chewable tablet *ANTIPLATELET* Take by mouth.    . carvediloL (COREG) 12.5 MG tablet TAKE 1 TABLET BY MOUTH TWICE A DAY WITH FOOD 180 tablet 3  . clotrimazole  (LOTRIMIN ) 1 % external solution clotrimazole  1 % topical solution    . diclofenac  (VOLTAREN ) 1 % Gel gel Apply topically.    . ergocalciferol  (VITAMIN D2) 50,000 unit capsule Pt taking once a week    2  . hydroCHLOROthiazide (MICROZIDE) 12.5 mg capsule TAKE 1 CAPSULE BY MOUTH EVERY DAY 90 capsule 3  . ibuprofen  (ADVIL ,MOTRIN ) 800 MG tablet TAKE 1 TABLET (800 MG TOTAL) BY MOUTH IN THE MORNING AND AT BEDTIME.    . ramipriL (ALTACE) 5 MG capsule TAKE 1 CAPSULE BY MOUTH EVERY DAY 90 capsule 3  . rosuvastatin  (CRESTOR ) 10 MG tablet Take 1 tablet (10 mg total) by mouth daily. 30 tablet 5  . [DISCONTINUED] rosuvastatin  (CRESTOR ) 10 MG tablet Take 1 tablet (10 mg total) by mouth daily. 90 tablet 1   No current facility-administered medications on file prior to visit.    She  has a past surgical history that includes Hysterectomy. Her family history  includes Cancer in her sister; Hypertension in her mother; Stroke in her mother. She has No Known Allergies..  Review of Systems - History obtained from the patient Complete ROS negative except those noted in the HPI or elsewhere in note  Objective:   Physical Exam Vital signs and nursing note reviewed.  Vitals:   08/02/20 0859  BP:  128/72  Pulse: 65  Temp: 98.3 F (36.8 C)  SpO2: 97%  Height: 1.575 m (5' 2)  Weight: 83.9 kg (185 lb)  BMI (Calculated): 33.9    General appearance: alert, appears stated age and cooperative Head: Normocephalic and atrauamatic Eyes: Conjunctiva clear, EOMs intact, no scleral icterus noted Neck: no adenopathy, no carotid bruit, no JVD; neck is supple, symmetrical; trachea midline and thyroid not enlarged, symmetric, no tenderness/mass/nodules Heart: regular rate and rhythm, S1, S2 normal, no murmur, click, rub or gallop Lungs: clear to auscultation bilaterally; no wheezes, rhonchi, rales and in no acute distress. Breathing is unlabored. Extremities: extremities normal, atraumatic, no cyanosis or edema Pulses: 2+ and symmetric  Psych: Mood and affect appropriate Skin: color, texture, and turgor normal; no rashes or lesions  Recent lab work reviewed and analyzed: Lab Results  Component Value Date   WBC 5.4 05/03/2018   HGB 12.6 05/03/2018   HCT 38.5 05/03/2018   PLT 275 05/03/2018   CHOL 209 (H) 01/01/2020   TRIG 99 01/01/2020   HDL 43 01/01/2020   LDL 146 (H) 01/01/2020   ALT 17 01/01/2020   AST 18 05/03/2018   NA 139 05/03/2018   K 4.0 05/03/2018   CL 107 05/03/2018   CREATININE 0.71 05/03/2018   BUN 17 05/03/2018   CO2 27 05/03/2018   GLU 108 (H) 05/03/2018      Recent imaging reviewed and analyzed: ARCHIVE IMAGE ONLY (NO INTERPRETATION) This exam was automatically finalized and will not be resulted.      Assessment/Plan    1. Chronic systolic congestive heart failure (HCC) Disease process and medications discussed. Questions answered fully. Emphasized salt restriction. Encouraged daily monitoring of the patient's weight. Encouraged regular exercise. Continue f/u with Cardiology  2. Primary idiopathic dilated cardiomyopathy (HCC) See above  3. Essential hypertension, benign Well controlled <140/90. Continue current medications. Blood Pressure Target  Goal: The goal for blood pressure is less than 140/90.   If you are checking your blood pressure at home, please record it and bring it to your next office visit. Following the Dietary Approaches to Stop Hypertension (DASH) diet (3 servings of fruit and vegetables daily, whole grains, low sodium, low-fat proteins) and exercising for 30 minutes 3-4 times per week is also generally recommended for those with high blood pressure. - Basic Metabolic Panel; Future - Basic Metabolic Panel - TSH, 3rd Generation; Future  4. Mixed hyperlipidemia Called pharmacy to cancel any remaining atorvastatin rx's. Should be on crestor  10 mg qhs. Continue f/u with Cardiology.  5. Vitamin D  deficiency Recheck today. Prefers high dose schedule of once weekly. - 25(OH) Vitamin D  Total; Future   Patient expresses understanding of their current medications and use.  If a new prescription was given today, then I discussed potential side effects, drug interactions, instructions for taking the medication, and the consequences of not taking it.   Patient verbalized an understanding of these instructions. Patient's medical and personal goals were discussed today. Barriers to current goals: none evident The following portions of the patient's history were reviewed and updated as appropriate: allergies, current medications, past family history, past medical history,  past social history, past surgical history and problem list. Return in about 6 months (around 02/02/2021) for hypertension. Call sooner if need arises.  Future Appointments      Provider Department Dept Phone Center   06/24/2021 10:00 AM Leot[facility] - [facility] Bldg [phone] [address]       This document serves as a record of services personally performed by Tinnie Forts, PA-C.  It was created on their behalf by Clovis LITTIE Mandril, CMA, a trained medical scribe, and Certified Medical Assistant (CMA). During  the course of documenting the history, physical exam and medical decision making, I was functioning as a stage manager. The creation of this record is the provider's dictation and/or activities during the visit.  Electronically signed by Clovis LITTIE Mandril, CMA 08/02/2020 9:08 AM    I agree the documentation is accurate and complete.  Electronically signed by: Tinnie Almarie Forts, PA-C 08/02/2020 9:43 AM      Electronically signed by: Tinnie Almarie Forts, PA-C 08/02/20 [phone]

## 2020-08-02 NOTE — Telephone Encounter (Signed)
**  REFILL REQUEST**   Name of Medicine: Ramipril 5 mg,   Dose:  Cardiologist/Vascular surgeon: Toribio  Pharmacy Used:  [facility] [id number] [place]  90 days supply with 3 refills  Pharmacy Phone #:  Next Appt scheduled:

## 2020-08-17 IMAGING — CT CT WRIST*R* W/O CM
3 of 5 series · 9 of 20 positions shown, 10 images · non-contrast
Comparison: Radiographs 11/25/2018.  CT 12/01/2018.

CLINICAL DATA: Fall 1 year ago with distal ulnar fracture. Evaluate
ulnocarpal abutment syndrome.

EXAM:
CT OF THE RIGHT WRIST WITHOUT CONTRAST
TECHNIQUE: Multidetector CT imaging of the right wrist was performed according
to the standard protocol. Multiplanar CT image reconstructions were
also generated.

[Series 3: wrist 1.50 br60 s3 axial bone hd fov · axial · 0.29mm/px · z∈[-790,-728]mm · 4 of 156 slices shown, 5 images]
[im 32/156  soft-tissue]
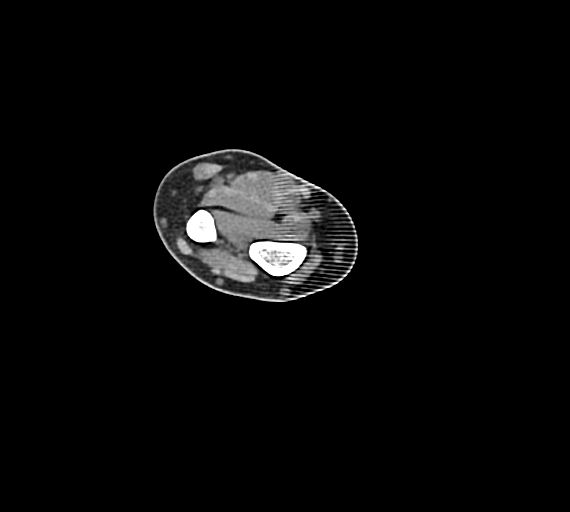
[im 32/156  bone]
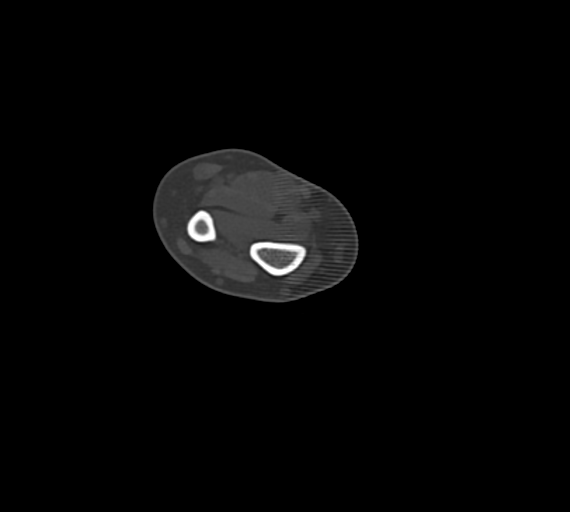
[im 63/156  bone]
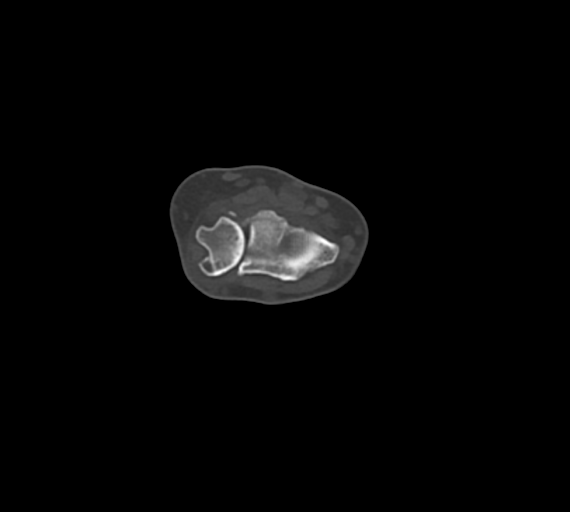
[im 94/156  bone]
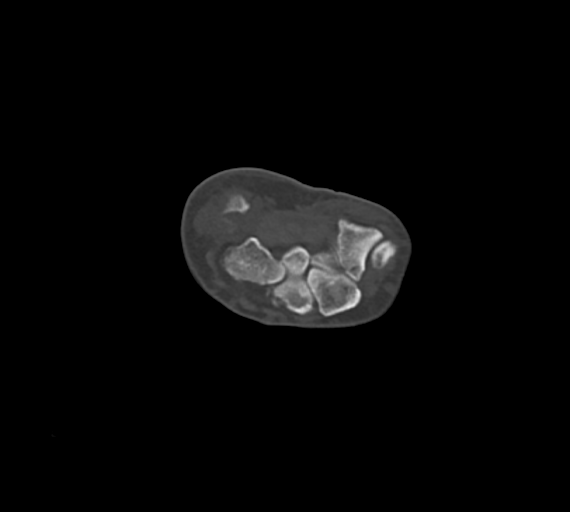
[im 125/156  bone]
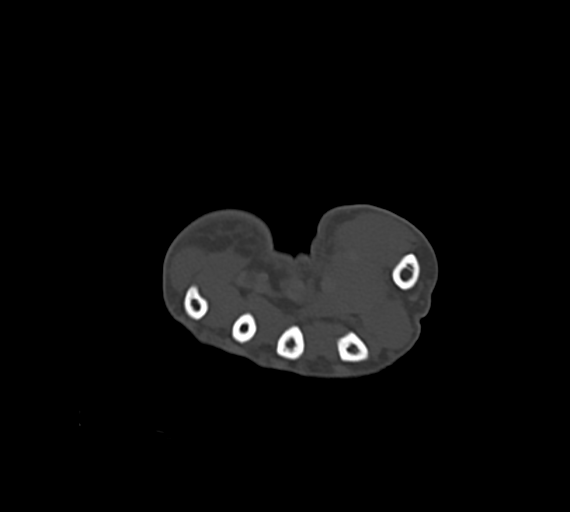

[Series 5: wrist 1.50 br40 s3 axial st hd fov · axial · 0.29mm/px · z∈[-790,-728]mm · 4 of 156 slices shown]
[im 32/156  bone]
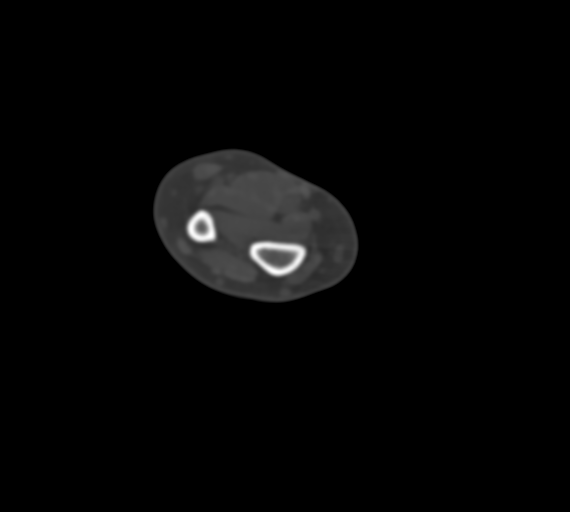
[im 63/156  bone]
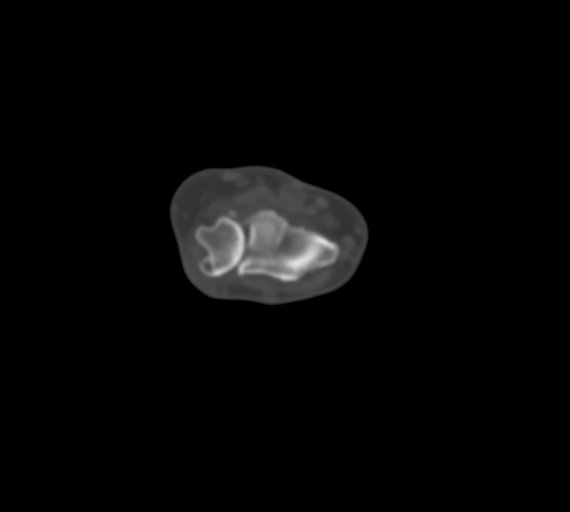
[im 94/156  bone]
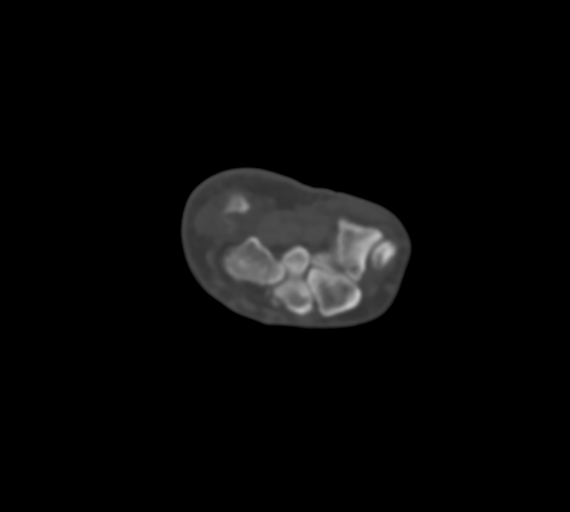
[im 125/156  bone]
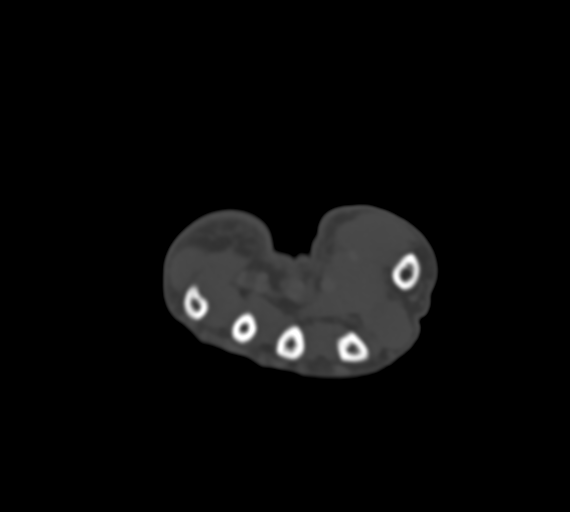

[Series 1013: cor bone · coronal · 0.30mm/px · 1 of 34 slices shown]
[im 17/34  bone]
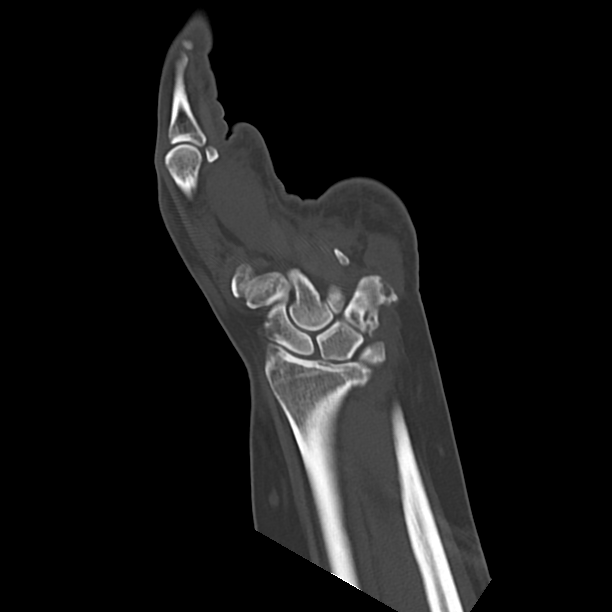

[9 of 20 positions shown; findings below may reference images not displayed]

FINDINGS: Bones/Joint/Cartilage

No evidence of acute fracture, dislocation or carpal bone
osteonecrosis. Marked ulnar positive variance again noted at the
wrist with advanced degenerative changes at the distal radioulnar
joint. There are large osteophytes at the DRUJ. Prominent cystic
changes are again noted within the distal ulna, triquetrum and
pisiform, unchanged. Mild radiocarpal joint space narrowing is
present. There are stable mild degenerative changes at the
scaphotrapeziotrapezoidal and metacarpal phalangeal joints.

Ligaments

Suboptimally assessed by CT. Due to the ulnar positive variance
there is marked chronic impingement on the triangular fibrocartilage
which is likely chronically torn.

Muscles and Tendons

Unremarkable.  No significant tenosynovitis.

Soft tissues

The periarticular soft tissues are unremarkable. There are probable
moderate effusions of the radiocarpal and distal radioulnar
compartments.
IMPRESSION: 1. Stable CT of the right wrist.  No acute findings.
2. Marked ulnar positive variance at the wrist with advanced
degenerative changes at the distal radioulnar joint and chronic
impingement on the triangular fibrocartilage (ulnocarpal abutment).
3. Stable mild degenerative changes at the scaphotrapeziotrapezoidal
and metacarpal phalangeal joints.
4. Probable moderate effusions of the radiocarpal and distal
radioulnar compartments.

## 2020-09-14 ENCOUNTER — Other Ambulatory Visit: Payer: Self-pay | Admitting: Internal Medicine

## 2020-10-04 ENCOUNTER — Other Ambulatory Visit: Payer: Self-pay | Admitting: Sports Medicine

## 2020-11-04 NOTE — Progress Notes (Signed)
 Lab visit   Electronically signed by: Rosaline JONELLE Overland, CMA 11/04/20 [phone]

## 2020-11-06 NOTE — Progress Notes (Signed)
 Subjective:     Patient ID:  Summer Watkins is a 70 y.o. female who presents today for Results (Pt state she has had her labs recently completed and is here to discuss her results.)  Summer Watkins presents today for management of chronic medical problems that are all stable. Reports taking medications as prescribed/directed and feels well today.  Hypertension Patient is here for follow-up of elevated blood pressure. She is taking medication as prescribed and feels well today. Denies side effects from the medication. Patient denies chest pain, dyspnea, exertional chest pressure/discomfort, palpitations, syncope and worsening edema.  BP Readings from Last 3 Encounters:  11/06/20 132/78  08/02/20 128/72  06/24/20 140/84   CHF, idiopathic cardiomyopathy Patient presents today for follow up of chronic congestive heart failure. She is followed by Cardiology. Patient's current complaints are: none. She denies chest pain, chest pressure/discomfort, claudication, dyspnea, exertional chest pressure/discomfort, fatigue, irregular heart beat, lower extremity edema and orthopnea. She states she is compliant all of the time with her medications. She states she is compliant most of the time with her diet.  Hyperlipidemia Presents in follow up of her cholesterol. Current lipid lowering medications reviewed. Tolerating medication well. No side effects. No myalgias. Lipid panel is current. Lab Results  Component Value Date   CHOL 175 11/04/2020   TRIG 103 11/04/2020   HDL 41 11/04/2020   LDL 113 (H) 11/04/2020   The 10-year ASCVD risk score Verdon DC Jr., et al., 2013) is: 10.6%  The following portions of the patient's history were reviewed and updated as appropriate: Patient Active Problem List  Diagnosis  . Chronic systolic congestive heart failure (HCC)  . Essential hypertension, benign  . Pure hypercholesterolemia  . Ulnocarpal abutment syndrome, right  . Right wrist pain  .  Arthritis of right wrist  . Primary osteoarthritis of first carpometacarpal joint of right hand  . Hyperlipidemia  . Obesity  . Porokeratosis  . Primary idiopathic dilated cardiomyopathy (HCC)    Current Outpatient Medications on File Prior to Visit  Medication Sig Dispense Refill  . aspirin 81 MG chewable tablet *ANTIPLATELET* Take by mouth.    . carvediloL (COREG) 12.5 MG tablet TAKE 1 TABLET BY MOUTH TWICE A DAY WITH FOOD 180 tablet 3  . ergocalciferol  (VITAMIN D2) 50,000 unit capsule Pt taking once a week    2  . hydroCHLOROthiazide (MICROZIDE) 12.5 mg capsule Take 1 capsule (12.5 mg total) by mouth daily. 90 capsule 3  . hydrocortisone  2.5 % cream Apply topically 2 times daily. to affected area    . ibuprofen  (ADVIL ,MOTRIN ) 800 MG tablet TAKE 1 TABLET (800 MG TOTAL) BY MOUTH IN THE MORNING AND AT BEDTIME.    . ramipriL (ALTACE) 5 MG capsule Take 1 capsule (5 mg total) by mouth daily. 90 capsule 3  . rosuvastatin  (CRESTOR ) 10 MG tablet Take 1 tablet (10 mg total) by mouth daily. 30 tablet 5  . [DISCONTINUED] carvediloL (COREG) 12.5 MG tablet TAKE 1 TABLET BY MOUTH TWICE A DAY WITH FOOD 180 tablet 3  . [DISCONTINUED] clotrimazole  (LOTRIMIN ) 1 % external solution clotrimazole  1 % topical solution    . [DISCONTINUED] diclofenac  (VOLTAREN ) 1 % Gel gel Apply topically.     No current facility-administered medications on file prior to visit.    She  has a past surgical history that includes Hysterectomy. Her family history includes Cancer in her sister; Hypertension in her mother; Stroke in her mother. She has No Known Allergies..  Review of Systems -  History obtained from the patient Complete ROS negative except those noted in the HPI or elsewhere in note  Objective:   Physical Exam Vital signs and nursing note reviewed.  Vitals:   11/06/20 1107  BP: 132/78  Pulse: 81  Temp: 97.8 F (36.6 C)  SpO2: 98%  Height: 1.575 m (5' 2)  Weight: 85 kg (187 lb 6.4 oz)  BMI  (Calculated): 34.3    General appearance: alert, appears stated age and cooperative Head: Normocephalic and atrauamatic Eyes: Conjunctiva clear, EOMs intact, no scleral icterus noted Neck: no adenopathy, no carotid bruit, no JVD; neck is supple, symmetrical; trachea midline and thyroid not enlarged, symmetric, no tenderness/mass/nodules Heart: regular rate and rhythm, S1, S2 normal, no murmur, click, rub or gallop Lungs: clear to auscultation bilaterally; no wheezes, rhonchi, rales and in no acute distress. Breathing is unlabored. Extremities: extremities normal, atraumatic, no cyanosis or edema Pulses: 2+ and symmetric  Psych: Mood and affect appropriate Skin: color, texture, and turgor normal; no rashes or lesions  Recent lab work reviewed and analyzed: Lab Results  Component Value Date   WBC 5.4 05/03/2018   HGB 12.6 05/03/2018   HCT 38.5 05/03/2018   PLT 275 05/03/2018   CHOL 175 11/04/2020   TRIG 103 11/04/2020   HDL 41 11/04/2020   LDL 113 (H) 11/04/2020   ALT 17 01/01/2020   AST 18 05/03/2018   NA 143 08/02/2020   K 3.9 08/02/2020   CL 107 08/02/2020   CREATININE 0.78 08/02/2020   BUN 15 08/02/2020   CO2 27 08/02/2020   TSH 2.33 11/04/2020   GLU 123 (H) 08/02/2020   HBA1C 5.4 11/04/2020      Recent imaging reviewed and analyzed: ARCHIVE IMAGE ONLY (NO INTERPRETATION) This exam was automatically finalized and will not be resulted.      Assessment/Plan    1. Mixed hyperlipidemia Recent lab work reviewed.  Cholesterol has improved since starting Crestor . Continue current medication Cholesterol Goals:  The goal is to have your total cholesterol < 200, the HDL (good cholesterol) >40, the LDL (bad cholesterol) <100 and the TG (Triglyceride cholesterol) <150. It is recommended that you follow a healthy, low fat diet and exercise for 30 minutes 3-4 times a week.  2. Essential hypertension, benign Well controlled <140/90. Continue current medications. Blood  Pressure Target Goal: The goal for blood pressure is less than 140/90.   If you are checking your blood pressure at home, please record it and bring it to your next office visit. Following the Dietary Approaches to Stop Hypertension (DASH) diet (3 servings of fruit and vegetables daily, whole grains, low sodium, low-fat proteins) and exercising for 30 minutes 3-4 times per week is also generally recommended for those with high blood pressure.   3. Primary idiopathic dilated cardiomyopathy (HCC) 4. Chronic systolic congestive heart failure (HCC) Euvolemic. Stable per history and exam   Patient expresses understanding of their current medications and use.  If a new prescription was given today, then I discussed potential side effects, drug interactions, instructions for taking the medication, and the consequences of not taking it.   Patient verbalized an understanding of these instructions. Patient's medical and personal goals were discussed today. Barriers to current goals: none evident The following portions of the patient's history were reviewed and updated as appropriate: allergies, current medications, past family history, past medical history, past social history, past surgical history and problem list. Return in about 6 months (around 05/08/2021) for hypertension, hyperlipidemia. Call sooner if need  arises.  Future Appointments      Provider Department Dept Phone Center   05/08/2021 9:00 AM Lauren Almarie Forts [facility] - [place] [phone]   06/24/2021 10:00 AM Leotis Julian Sella Cardiology - [facility] Bldg [phone] LOISE [address]        This document serves as a record of services personally performed by Tinnie Forts, PA-C.  It was created on their behalf by Clovis LITTIE Mandril, CMA, a trained medical scribe, and Certified Medical Assistant (CMA). During the course of documenting the history,  physical exam and medical decision making, I was functioning as a stage manager. The creation of this record is the provider's dictation and/or activities during the visit.  Electronically signed by Clovis LITTIE Mandril, CMA 11/06/2020 11:23 AM     I agree the documentation is accurate and complete.  Electronically signed by: Tinnie Almarie Forts, PA-C 11/06/2020 11:58 AM      Electronically signed by: Tinnie Almarie Forts, PA-C 11/06/20 1159

## 2020-12-05 ENCOUNTER — Other Ambulatory Visit: Payer: Self-pay | Admitting: Sports Medicine

## 2020-12-06 NOTE — Telephone Encounter (Signed)
Please advise 

## 2021-01-03 ENCOUNTER — Other Ambulatory Visit: Payer: Self-pay | Admitting: Sports Medicine

## 2021-01-07 NOTE — Telephone Encounter (Signed)
Please Advise

## 2021-02-22 ENCOUNTER — Other Ambulatory Visit: Payer: Self-pay | Admitting: Internal Medicine

## 2021-05-01 ENCOUNTER — Encounter: Payer: BC Managed Care – PPO | Admitting: Internal Medicine

## 2021-05-08 ENCOUNTER — Encounter: Payer: BC Managed Care – PPO | Admitting: Internal Medicine

## 2021-05-13 ENCOUNTER — Other Ambulatory Visit: Payer: Self-pay | Admitting: Internal Medicine

## 2021-07-21 ENCOUNTER — Other Ambulatory Visit: Payer: Self-pay | Admitting: Internal Medicine

## 2021-08-01 ENCOUNTER — Other Ambulatory Visit: Payer: Self-pay | Admitting: Sports Medicine

## 2021-12-24 LAB — COLOGUARD: COLOGUARD: POSITIVE — AB

## 2023-07-02 NOTE — Progress Notes (Signed)
 Patient Name: Summer Watkins Patient Date of Birth: 01-01-1951 Patient MR#: [id number]  PCP: Tinnie Almarie Forts, PA-C Date: 07/02/2023      Cardiology Clinic Note  Assessment and Plan:    1.    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  This was diagnosed in 2003, at which time she had a heart catheterization showing normal coronary arteries and an EF of 35 to 40%.  Her most recent echocardiogram from 08/05/2022 showed EF 40%.  This has not changed appreciably since her previous echo from 2014.   Clinically, she is doing fine with her heart failure.  NYHA class I-II.  We talked about the importance of try to get more exercise.  She is really interested in both exercise and weight loss.  We talked about all of this at length.  She will continue the same medical therapy, consisting of carvedilol, ramipril, and hydrochlorothiazide.  She seems to be doing very well.  No changes today.   2. Hypertension.  Well-controlled today, 131/81   3.  Hyperlipidemia.     This is being managed by the PCP.  Currently diet controlled.   ORDERS PLACED TODAY: 1.  None   FOLLOW UP: The patient will come back in 12 months, sooner if needed.     Alverna SAUNDERS. Toribio DO [facility] Interventional Cardiology      Subjective:    Reason for Consult/Visit: Follow-up (One year//)    History of Present Illness: Summer Watkins is a 73 y.o. female who presents for routine follow-up appointment for her chronic systolic heart failure.  History of Present Illness The patient presents for evaluation of heart failure, weight management, vitamin D  supplementation, and leg swelling.  She reports a stable cardiac condition, with no chest pain or leg swelling. She experiences shortness of breath during physical exertion but not at rest. She had an echocardiogram on 08/25/2022.  She acknowledges a lack of energy, which she attributes to her sedentary lifestyle and  excess weight. She expresses a desire to increase her physical activity, specifically through walking.  She is currently on a regimen of vitamin D  supplements and is seeking advice on whether this should be a lifelong commitment. She has a scheduled appointment with Dr. Forts in December 2025.  She recalls an episode of bronchitis last year, during which she experienced leg swelling. Her niece, a engineer, civil (consulting), advised her to undergo an x-ray to rule out a blood clot. The cause of the swelling remains unknown to her.  She admits to struggling with dietary control, particularly in relation to sweets, even though she maintains a low bread intake. She is considering the use of weight loss medications but is uncertain about their coverage under Medicare.  FAMILY HISTORY Her niece had a valve replacement surgery.  MEDICATIONS Current: vitamin D     Medication side effects:none  The following portions of the patient's history were reviewed and updated as appropriate:  Medical History: Past Medical History:  Diagnosis Date  . Benign essential hypertension   . Chronic idiopathic constipation   . Chronic systolic congestive heart failure (HCC)   . Diverticular disease of colon   . Hyperlipidemia   . Hypertensive heart disease with chronic systolic congestive heart failure (CMD)   . Impaired fasting glucose   . Obesity due to excess calories with serious comorbidity   . Porokeratosis   . Primary idiopathic dilated cardiomyopathy (HCC)   . Primary osteoarthritis of first carpometacarpal joint of right hand   .  Pure hypercholesterolemia   . Ulnocarpal abutment syndrome, right   . Vitamin D  deficiency       Medications:  Current Outpatient Medications  Medication Sig Dispense Refill  . aspirin 81 mg chewable tablet Take  by mouth.    . carvediloL (COREG) 12.5 mg tablet Take 1 tablet (12.5 mg total) by mouth in the morning and 1 tablet (12.5 mg total) in the evening. Take with meals. Take  with food.. 180 tablet 3  . ergocalciferol  (VITAMIN D2) 1,250 mcg (50,000 unit) capsule TAKE 1 CAPSULE BY MOUTH ONCE WEEKLY 12 capsule 7  . hydroCHLOROthiazide (HYDRODIURIL) 12.5 mg capsule TAKE 1 CAPSULE BY MOUTH EVERY DAY 90 capsule 1  . hydrocortisone  2.5 % cream Apply  topically 2 (two) times a day.    . hydrOXYzine pamoate (VISTARIL) 25 mg capsule TAKE 1 TO 2 CAPSULES BY MOUTH NIGHTLY AS NEEDED 30 capsule 2  . ibuprofen  (MOTRIN ) 800 mg tablet TAKE 1 TABLET BY MOUTH EVERY 8 HOURS AS NEEDED FOR PAIN 60 tablet 0  . nebulizers misc Use as directed. 1 each 0  . ramipriL (ALTACE) 5 mg capsule Take 1 capsule (5 mg total) by mouth daily.    SABRA ezetimibe (ZETIA) 10 mg tablet Take 10 mg by mouth Once Daily. Indications: high cholesterol (Patient not taking: Reported on 07/02/2023) 30 tablet 5   No current facility-administered medications for this visit.    Allergies: Allergies  Allergen Reactions  . Rosuvastatin  Arthralgias and Myalgias  . Prednisone GI Intolerance    Stomach upset at the end of taking a prescribed dose pack    Social History: Social History   Tobacco Use  Smoking Status Never  Smokeless Tobacco Never   Social History   Substance and Sexual Activity  Alcohol Use Not Currently   Social History   Substance and Sexual Activity  Drug Use Not on file    Family History: There is no family history of premature coronary artery disease in any primary relatives. Family History  Problem Relation Name Age of Onset  . Hypertension Mother    . Stroke Mother    . Cancer Sister         pancreas    Review of Systems Pertinent items are noted in HPI.     Objective:      Physical Exam:  VITAL SIGNS:  Blood pressure 130/81, pulse 67, height 1.575 m (5' 2), weight 87.5 kg (193 lb), SpO2 97%. Wt Readings from Last 3 Encounters:  07/02/23 87.5 kg (193 lb)  05/18/23 86.6 kg (191 lb)  03/25/23 87.7 kg (193 lb 6.4 oz)   Body mass index is 35.3 kg/m.  General  Appearance:  Elderly African-American female.  Short stature.  Overweight.  Looks generally well.  Very pleasant.  Normal mood and affect.  No distress of any kind.   HEENT:   PERRL, EOMI.  Oropharynx is moist. No icterus.   Neck: Carotid pulses are 2+.  No bruits.  No adenopathy, thyromegaly or masses.  Jugular venous pressure is normal.   Lungs:   Respirations are unlabored. The lungs are clear in all fields bilaterally without rales, rhonchi or wheezing.    Percussion note is normal throughout.   Heart:  Heart sounds are regular with a rate of approximately 60-65 beats per minute.  Normal S1. Normal S2.  No S3 or S4.  There are no murmurs.    Abdomen:   Soft, nontender and nondistended.  There are normal bowel sounds.  No  bruits.  No masses or organomegaly.    Extremities: Warm. No rashes or ulcers. No lower extremity pitting edema.   Pulses: Radial pulses are 2+ and symmetrical.   Skin: No lower extremity rashes or ulcers   Neurologic:  The patient is awake, alert, and oriented to time, place, person and situation, and no strength deficits.      EKG:   Lab Review:   .    Lab Results  Component Value Date   CHOL 222 (H) 05/18/2023   LDLCALC 155 (H) 05/18/2023   NONHDLC 175 05/18/2023   HDL 47 (L) 05/18/2023   BNP 249 (H) 11/10/2022   No results found for: HGB  Lab Results  Component Value Date   CREATININE 0.87 05/18/2023   Lab Results  Component Value Date   LDLCALC 155 (H) 05/18/2023       CVD RISK STRATIFICATION RESULTS: Lab Results  Component Value Date   CHOL 222 (H) 05/18/2023   LDLCALC 155 (H) 05/18/2023   HDL 47 (L) 05/18/2023   TSH 1.214 11/10/2022   HGBA1C 5.7 (H) 05/18/2023    The 10-year ASCVD risk score (Arnett DK, et al., 2019) is: 14.4%   Values used to calculate the score:     Age: 81 years     Sex: Female     Is Non-Hispanic African American: Yes     Diabetic: No     Tobacco smoker: No     Systolic Blood Pressure: 130 mmHg     Is  BP treated: Yes     HDL Cholesterol: 47 mg/dL     Total Cholesterol: 222 mg/dL

## 2024-03-13 NOTE — Progress Notes (Signed)
 [facility]: GDMT Medication Titration Heart Failure Clinic:  Patient name: Summer Watkins  Date of birth: Jul 20, 1950 Date of visit: 03/13/2024 Referring Provider: No ref. provider found   Primary Care Provider: Tinnie Almarie Forts, PA-C       Candidate for Advanced HF Therapies: No ; age  NYHA CLASS: II  ACCF/AHA STAGE: C  LAST LVEF%: 30-35% on TTE from 01/05/24  GDMT:  BB: Carvedilol 12.5 mg PO BID  ARNI/ARB/ACE-I: Ramipril 5 mg PO QD  MRA: Spironolactone 25 mg PO QD  SGLT2i: unable to tolerate due to GI upset/ nausea    EP DEVICE:   Initial HFC GDMT Visit: 02/02/24                                    Chief Complaint:  Chief Complaint  Patient presents with  . Follow-up   Assessment & Plan 1) Chronic Heart Failure Reduced Ejection Fraction (HFrEF) LVEF 30-35% on TTE from 01/05/24; diagnosed in 2003, at which time she had a heart catheterization showing normal coronary arteries; NICM; NYHA Class II; ACCF/AHA Stage C; euvolemic, stable, well perfused, well compensated  GDMT - BB- Carvedilol 12.5 mg PO BID - ACE-I- Ramipril 5 mg PO QD - SGLT2i- unable to tolerated due to GI upset/nausea  - MRA- Spironolactone 25 mg PO QD  - Loop Diuretic- Furosemide 20 mg PO QD - labs were last obtained on 03/03/24- (BNP, BMP, MG)--- results reviewed-- MG level was slightly low. BNP was WNL. BMP looks wonderful, all values are WNL, except for slightly elevated glucose (132).  - In pursuit of GDMT, unfortunately, patient is unable to tolerate an SGLT2i, due to GI intolerance. I would like to attempt to switch out her ACE-I, for the strongly recommended ARNI after the recommended 36-hour washout period, but due to insurance (monthly co-pay was $1000/month)-- she continue with her ACE-I.  - I continued to have an in-depth conversation with patient about the overall prognosis and the pathophysiology behind his HFrEF. Also, in this conversation we discussed  the overall HFrEF treatment and diagnosis including guideline-directed medical therapy (GDMT), reviewing each category and formulated a plan for optimization. The patient was informed about the importance of overall compliance with the current medication regimen and the recommended lifestyle modifications (low-sodium diet, daily fluid restriction, low impact exercise regimen as tolerated, daily home weights, daily BP readings, frequent GDMT HFC visits, frequent labs, frequent GDMT med changes). The patient was given ample opportunity to ask questions, all of which were answered to their satisfaction.  Patient verbalized understanding.    - I have ordered and now scheduled a repeat TTE for 06/08/24 at 10 AM, for re-evaluation of patient's current LVEF, since GDMT regimen will be optimized at that point.    I want to see her in 8 weeks for routine follow up in the [facility], with Jolaine Ferretti, FNP-C   Jolaine Nat Ferretti, FNP-C, APP  Advanced Heart Failure, Transplant Cardiology, & Pulmonary Hypertension  Office: [phone] 03/13/2024 10:28 AM  History Present Illness:   History of Present Illness The patient is a 73 year old female who presents for evaluation of bilateral lower extremity edema. She has chronic systolic heart failure, hypertension, and hyperlipidemia. She has been experiencing swelling in her ankles for the past 2.5 to 3 months, which she initially attributed to heat. She does not have any medications to reduce the swelling and is concerned about  the underlying cause.  She is uncertain if her current medication could be causing the swelling. She is aware of her heart failure condition and reports consuming a significant amount of salt. The swelling is not associated with pain or injuries. Occasionally, her left ankle swells more than the right. She experiences occasional shortness of breath but does not lie flat at night, preferring to sleep on her side with one pillow. She reports a sensation  of fullness in her abdomen but does not believe it is due to fluid accumulation. She has not been monitoring her weight as recommended and notes that she is currently heavier than ever before. She reports no coughing, wheezing, or coughing up fluid. She reports not drinking enough water. She has a history of high blood pressure and high cholesterol. She is not currently on any cholesterol medication due to previous experiences of muscle aches. She has tried several statins, including Lipitor, but cannot recall if she has tried Zetia. Her blood pressure has been stable at home, and she has a blood pressure cuff for monitoring.   02/02/24 (UNG): Ms. Trafton is here for evaluation in the Heart Failure GDMT Medication Titration Clinic for optimization of her HFrEF regimen. She denies any acute distress and states she is doing well from a cardiac standpoint. She was referred to our clinic through her general cardiology provider, Samandra Tann, NP. Jamaira admits to experience some baseline fatigue and tiredness, when performing her ADLs.  She also admits to having baseline bilateral lower extremity edema at times. Otherwise, Patient denies any acute chest pain, pressure or palpitations. Patient denies any dizziness, lightheadedness or syncopal episodes. Also, patient denies any abdominal distention. Patient denies any shortness of breath or dyspnea on exertion. Patient reports adhering to the recommended low sodium diet, daily fluid restriction and current medication regimen. She is currently on a GLP-1 weekly weight loss injection, to help expedite her weight loss journey. Salam is aware that she needs to increase her overall protein intake, and decrease her sugar intake.  She has an advanced understanding in regards to the purpose of GDMT medications, improving her overall HFrEF prognosis.  02/16/24 (UNG): [name] is here for routine follow-up in the heart failure clinic.  She states that overall she is doing  okay and denies any acute distress at this time. Unfortunately, she was unable to tolerate the newly added SGLT2i, from last heart failure clinic visit- due to GI upset and nausea. She reports that she only took it for 3 days total. Ms. Alejo denies any acute chest pain, pressure or palpitations. Patient denies any dizziness, lightheadedness or syncopal episodes. Also, patient denies any abdominal distention or bilateral lower extremity edema, outside of his baseline. Patient denies any shortness of breath or dyspnea on exertion. Patient reports adhering to the recommended low sodium diet, daily fluid restriction and current medication regimen. Willowdean states that she is going to enroll in the Silver sneakers program at her [employer], to start a low impact regular routine exercise regimen.  03/13/24 NED): Ms. Losier presents for HFrEF follow up in the Heart Failure Clinic. She denies any acute distress and reports she is doing pretty good from a cardiac standpoint. Patient denies any acute chest pain, pressure or palpitations. Patient denies any dizziness, lightheadedness or syncopal episodes. Also, patient denies any abdominal distention or bilateral lower extremity edema, outside of his baseline. Patient denies any shortness of breath or dyspnea on exertion. Patient reports adhering to the recommended low sodium diet, daily fluid  restriction and current medication regimen.                    SOCIAL HISTORY Diet: Uses a lot of salt in cooking.Coffee/Tea/Caffeine-containing Drinks: The patient drinks coffee.  PAST SURGICAL HISTORY: - Bypass surgery - Hysterectomy  Past Medical History:  Medical History[1]   Social History:   Social History   Socioeconomic History  . Marital status: Single    Spouse name: Not on file  . Number of children: Not on file  . Years of education: Not on file  . Highest education level: Not on file  Occupational History  . Not on file  Tobacco  Use  . Smoking status: Never  . Smokeless tobacco: Never  Substance and Sexual Activity  . Alcohol use: Not Currently  . Drug use: Not Currently  . Sexual activity: Not on file  Other Topics Concern  . Not on file  Social History Narrative  . Not on file   Social Drivers of Health   Food Insecurity: Not on file  Transportation Needs: Not on file  Safety: Low Risk  (11/16/2023)   Safety   . How often does anyone, including family and friends, physically hurt you?: Never   . How often does anyone, including family and friends, insult or talk down to you?: Never   . How often does anyone, including family and friends, threaten you with harm?: Never   . How often does anyone, including family and friends, scream or curse at you?: Never  Living Situation: Not on file    Family History :  Family History[2]   Current Medications: Current Medications[3]  Allergies:   Allergies[4]   Review Of  Systems:  Negative except lower extremity edema History obtained from chart review and the patient General ROS: negative Hematological and Lymphatic ROS: negative Endocrine ROS: negative Respiratory ROS: no cough, shortness of breath, or wheezing Cardiovascular ROS: no chest pain or dyspnea on exertion Gastrointestinal ROS: no abdominal pain, change in bowel habits, or black or bloody stools Musculoskeletal ROS: negative  Vital Signs:  BP 114/62   Pulse 76   Ht 1.575 m (5' 2)   Wt 91.1 kg (200 lb 14.4 oz)   SpO2 98%   BMI 36.75 kg/m  Wt Readings from Last 3 Encounters:  03/13/24 91.1 kg (200 lb 14.4 oz)  02/16/24 90.7 kg (200 lb)  02/02/24 91.6 kg (202 lb)    Physical Examination Constitutional: See Vital Signs                          No Acute Distress Normal Body Habitus and development  Eyes:  Normal conjunctivae Ears Nose and Throat: Normal  Neck: No significant jugular venous distension.  Thyroid does not appear enlarged visually Respiratory:   No increased work of  breathing; CTBA; No wheezes rhonchi or rales  Cardiovascular Exam:   No palpable thrills or lifts, PMI non displaced.  S1 S2   regular rate and  rhythm   No murmurs;  No rubs or gallop.   Normal Carotid upstroke, No Carotid Bruits  No abnormal abdominal pulsation or bruits  Pedal/radial Pulses - present  2+significant edema to left lower extremity; No varicosities   Abdomen  Soft non tender;  non distended, + bowel sounds, No obvious HSM or masses Ext:  No clubbing or cyanosis  MSK:  Generally normal strength;   Neuro:  Alert and Oriented x 2  Grossly normal  non focal, Normal mood and affect Psych:  Normal Mood and affect Skin: No rash      Diagnostic Studies:  Lab Results  Component Value Date   WBC 5.10 01/05/2024   HGB 12.4 01/05/2024   HCT 37.3 01/05/2024   PLT 266 01/05/2024    Lab Results  Component Value Date   NA 140 03/09/2024   K 4.0 03/09/2024   CL 103 03/09/2024   CO2 27 03/09/2024   BUN 21 03/09/2024   CREATININE 0.97 03/09/2024   GLUCOSE 132 (H) 03/09/2024   CALCIUM  9.3 03/09/2024   MG 1.8 (L) 03/09/2024    Lab Results  Component Value Date   BILITOT 0.6 11/16/2023   PROT 6.3 (L) 11/16/2023   ALBUMIN 3.9 11/16/2023   ALT 16 11/16/2023   AST 15 11/16/2023   ALP 76 11/16/2023    No results found for: LABPROT, INR, PTT  Lab Results  Component Value Date   CHOL 256 (H) 11/16/2023   CHOL 222 (H) 05/18/2023   CHOL 230 (H) 11/10/2022   Lab Results  Component Value Date   HDL 47 (L) 11/16/2023   HDL 47 (L) 05/18/2023   HDL 40 (L) 11/10/2022   Lab Results  Component Value Date   LDLCALC 187 (H) 11/16/2023   Lab Results  Component Value Date   TRIG 99 11/16/2023   TRIG 94 05/18/2023   TRIG 103 11/10/2022   Lab Results  Component Value Date   HDL 47 (L) 11/16/2023   HDL 47 (L) 05/18/2023   HDL 40 (L) 11/10/2022   Thank you for allowing me to participate in the care of your patient.  Please feel free to contact me ifyou need any  further information.  Jolaine Nat Ferretti, NP   Jolaine Nat Ferretti, FNP-C, APP  Advanced Heart Failure, Transplant Cardiology, & Pulmonary Hypertension  Office: [phone] 03/13/2024 10:28 AM       [1] Past Medical History: Diagnosis Date  . Benign essential hypertension   . Chronic idiopathic constipation   . Chronic systolic congestive heart failure (HCC)   . Diverticular disease of colon   . Hyperlipidemia   . Hypertensive heart disease with chronic systolic congestive heart failure    (CMD)   . Impaired fasting glucose   . Obesity due to excess calories with serious comorbidity   . Porokeratosis   . Primary idiopathic dilated cardiomyopathy (HCC)   . Primary osteoarthritis of first carpometacarpal joint of right hand   . Pure hypercholesterolemia   . Ulnocarpal abutment syndrome, right   . Vitamin D  deficiency   [2] Family History Problem Relation Name Age of Onset  . Hypertension Mother    . Stroke Mother    . Cancer Sister         pancreas  [3] Current Outpatient Medications  Medication Sig Dispense Refill  . aspirin 81 mg chewable tablet Take  by mouth.    . carvediloL (COREG) 12.5 mg tablet Take 1 tablet (12.5 mg total) by mouth in the morning and 1 tablet (12.5 mg total) in the evening. Take with meals. Take with food. 180 tablet 3  . ergocalciferol  (VITAMIN D2) 1,250 mcg (50,000 unit) capsule TAKE 1 CAPSULE BY MOUTH ONCE WEEKLY 12 capsule 7  . furosemide (LASIX) 20 mg tablet TAKE 1 TABLET BY MOUTH EVERY DAY 90 tablet 1  . hydroCHLOROthiazide (HYDRODIURIL) 12.5 mg capsule TAKE 1 CAPSULE BY MOUTH EVERY DAY 90 capsule 2  . hydrocortisone  2.5 % cream Apply  topically  2 (two) times a day.    . hydrOXYzine pamoate (VISTARIL) 25 mg capsule TAKE 1 TO 2 CAPSULES BY MOUTH NIGHTLY AS NEEDED 30 capsule 2  . mupirocin (BACTROBAN) 2 % ointment APPLY A SMALL AMOUNT TO THE AFFECTED AREA BY TOPICAL ROUTE 3 TIMES A DAY AS NEEDED    . nebulizers misc Use as directed. 1 each  0  . ramipriL (ALTACE) 5 mg capsule TAKE 1 CAPSULE BY MOUTH EVERY DAY 90 capsule 3  . spironolactone (ALDACTONE) 25 mg tablet Take 1 tablet (25 mg total) by mouth daily. 30 tablet 11  . semaglutide, weight loss, (Wegovy) 0.25 mg/0.5 mL subcutaneous pen injector Inject 0.5 mL (0.25 mg total) under the skin every 7 days. (Patient not taking: Reported on 03/13/2024) 2 mL 0   No current facility-administered medications for this visit.  [4] Allergies Allergen Reactions  . Rosuvastatin  Arthralgias and Myalgias  . Prednisone GI Intolerance    Stomach upset at the end of taking a prescribed dose pack

## 2024-04-09 ENCOUNTER — Emergency Department (HOSPITAL_BASED_OUTPATIENT_CLINIC_OR_DEPARTMENT_OTHER)

## 2024-04-09 ENCOUNTER — Inpatient Hospital Stay (HOSPITAL_COMMUNITY)

## 2024-04-09 ENCOUNTER — Inpatient Hospital Stay (HOSPITAL_BASED_OUTPATIENT_CLINIC_OR_DEPARTMENT_OTHER)
Admission: EM | Admit: 2024-04-09 | Discharge: 2024-04-19 | DRG: 872 | Disposition: A | Discharge status: Home / Self Care

## 2024-04-09 ENCOUNTER — Encounter (HOSPITAL_BASED_OUTPATIENT_CLINIC_OR_DEPARTMENT_OTHER): Payer: Self-pay | Admitting: Emergency Medicine

## 2024-04-09 ENCOUNTER — Other Ambulatory Visit: Payer: Self-pay

## 2024-04-09 DIAGNOSIS — K668 Other specified disorders of peritoneum: Principal | ICD-10-CM

## 2024-04-09 DIAGNOSIS — N179 Acute kidney failure, unspecified: Secondary | ICD-10-CM | POA: Diagnosis not present

## 2024-04-09 DIAGNOSIS — E66812 Obesity, class 2: Secondary | ICD-10-CM | POA: Diagnosis present

## 2024-04-09 DIAGNOSIS — I420 Dilated cardiomyopathy: Secondary | ICD-10-CM | POA: Diagnosis present

## 2024-04-09 DIAGNOSIS — R6520 Severe sepsis without septic shock: Secondary | ICD-10-CM | POA: Diagnosis present

## 2024-04-09 DIAGNOSIS — I700 Atherosclerosis of aorta: Secondary | ICD-10-CM | POA: Diagnosis present

## 2024-04-09 DIAGNOSIS — Z823 Family history of stroke: Secondary | ICD-10-CM | POA: Diagnosis not present

## 2024-04-09 DIAGNOSIS — Z791 Long term (current) use of non-steroidal anti-inflammatories (NSAID): Secondary | ICD-10-CM

## 2024-04-09 DIAGNOSIS — K5720 Diverticulitis of large intestine with perforation and abscess without bleeding: Secondary | ICD-10-CM | POA: Diagnosis present

## 2024-04-09 DIAGNOSIS — I110 Hypertensive heart disease with heart failure: Secondary | ICD-10-CM | POA: Diagnosis present

## 2024-04-09 DIAGNOSIS — R198 Other specified symptoms and signs involving the digestive system and abdomen: Secondary | ICD-10-CM | POA: Diagnosis present

## 2024-04-09 DIAGNOSIS — Z79899 Other long term (current) drug therapy: Secondary | ICD-10-CM

## 2024-04-09 DIAGNOSIS — Z8249 Family history of ischemic heart disease and other diseases of the circulatory system: Secondary | ICD-10-CM | POA: Diagnosis not present

## 2024-04-09 DIAGNOSIS — E785 Hyperlipidemia, unspecified: Secondary | ICD-10-CM | POA: Diagnosis present

## 2024-04-09 DIAGNOSIS — E876 Hypokalemia: Secondary | ICD-10-CM | POA: Diagnosis not present

## 2024-04-09 DIAGNOSIS — Z800 Family history of malignant neoplasm of digestive organs: Secondary | ICD-10-CM

## 2024-04-09 DIAGNOSIS — A419 Sepsis, unspecified organism: Principal | ICD-10-CM | POA: Diagnosis present

## 2024-04-09 DIAGNOSIS — Z7982 Long term (current) use of aspirin: Secondary | ICD-10-CM

## 2024-04-09 DIAGNOSIS — N839 Noninflammatory disorder of ovary, fallopian tube and broad ligament, unspecified: Secondary | ICD-10-CM | POA: Diagnosis present

## 2024-04-09 DIAGNOSIS — E872 Acidosis, unspecified: Secondary | ICD-10-CM | POA: Diagnosis present

## 2024-04-09 DIAGNOSIS — Z6836 Body mass index (BMI) 36.0-36.9, adult: Secondary | ICD-10-CM

## 2024-04-09 DIAGNOSIS — I5022 Chronic systolic (congestive) heart failure: Secondary | ICD-10-CM | POA: Diagnosis present

## 2024-04-09 DIAGNOSIS — Z90711 Acquired absence of uterus with remaining cervical stump: Secondary | ICD-10-CM

## 2024-04-09 DIAGNOSIS — K529 Noninfective gastroenteritis and colitis, unspecified: Secondary | ICD-10-CM | POA: Diagnosis present

## 2024-04-09 DIAGNOSIS — B379 Candidiasis, unspecified: Secondary | ICD-10-CM | POA: Diagnosis present

## 2024-04-09 DIAGNOSIS — R7303 Prediabetes: Secondary | ICD-10-CM | POA: Diagnosis present

## 2024-04-09 HISTORY — DX: Heart failure, unspecified: I50.9

## 2024-04-09 HISTORY — DX: Hyperlipidemia, unspecified: E78.5

## 2024-04-09 LAB — URINALYSIS, ROUTINE W REFLEX MICROSCOPIC
Bilirubin Urine: NEGATIVE
Glucose, UA: NEGATIVE mg/dL
Hgb urine dipstick: NEGATIVE
Ketones, ur: NEGATIVE mg/dL
Leukocytes,Ua: NEGATIVE
Nitrite: NEGATIVE
Protein, ur: NEGATIVE mg/dL
Specific Gravity, Urine: >1.046 — ABNORMAL HIGH (ref 1.005–1.030)
pH: 5 (ref 5.0–8.0)

## 2024-04-09 LAB — CBC
HCT: 38.9 % (ref 36.0–46.0)
Hemoglobin: 12.8 g/dL (ref 12.0–15.0)
MCH: 28.4 pg (ref 26.0–34.0)
MCHC: 32.9 g/dL (ref 30.0–36.0)
MCV: 86.3 fL (ref 80.0–100.0)
Platelets: 309 K/uL (ref 150–400)
RBC: 4.51 MIL/uL (ref 3.87–5.11)
RDW: 14.2 % (ref 11.5–15.5)
WBC: 17.6 K/uL — ABNORMAL HIGH (ref 4.0–10.5)
nRBC: 0 % (ref 0.0–0.2)

## 2024-04-09 LAB — LACTIC ACID, PLASMA
Lactic Acid, Venous: 2.1 mmol/L (ref 0.5–1.9)
Lactic Acid, Venous: 2.6 mmol/L (ref 0.5–1.9)
Lactic Acid, Venous: 1.7 mmol/L (ref 0.5–1.9)
Lactic Acid, Venous: 1.2 mmol/L (ref 0.5–1.9)

## 2024-04-09 LAB — COMPREHENSIVE METABOLIC PANEL WITH GFR
ALT: 24 U/L (ref 0–44)
AST: 18 U/L (ref 15–41)
Albumin: 3.9 g/dL (ref 3.5–5.0)
Alkaline Phosphatase: 65 U/L (ref 38–126)
Anion gap: 13 (ref 5–15)
BUN: 25 mg/dL — ABNORMAL HIGH (ref 8–23)
CO2: 23 mmol/L (ref 22–32)
Calcium: 10.3 mg/dL (ref 8.9–10.3)
Chloride: 103 mmol/L (ref 98–111)
Creatinine, Ser: 0.91 mg/dL (ref 0.44–1.00)
GFR, Estimated: >60 mL/min (ref >60)
Glucose, Bld: 177 mg/dL — ABNORMAL HIGH (ref 70–99)
Potassium: 4.5 mmol/L (ref 3.5–5.1)
Sodium: 139 mmol/L (ref 135–145)
Total Bilirubin: 0.9 mg/dL (ref 0.0–1.2)
Total Protein: 6.5 g/dL (ref 6.5–8.1)

## 2024-04-09 LAB — LIPASE, BLOOD: Lipase: <10 U/L — ABNORMAL LOW (ref 11–51)

## 2024-04-09 LAB — PROCALCITONIN: Procalcitonin: 3.69 ng/mL

## 2024-04-09 LAB — HEMOGLOBIN A1C
Hgb A1c MFr Bld: 5.7 % — ABNORMAL HIGH (ref 4.8–5.6)
Mean Plasma Glucose: 116.89 mg/dL

## 2024-04-09 LAB — BRAIN NATRIURETIC PEPTIDE: B Natriuretic Peptide: 283.1 pg/mL — ABNORMAL HIGH (ref 0.0–100.0)

## 2024-04-09 MED ORDER — MAGIC MOUTHWASH: 15.0000 mL | Freq: Four times a day (QID) | ORAL | Status: DC | PRN

## 2024-04-09 MED ORDER — HYDROCODONE-ACETAMINOPHEN 5-325 MG PO TABS
1.0000 | ORAL_TABLET | ORAL | Status: DC | PRN
  Administered 2024-04-10: 2 via ORAL
  Filled 2024-04-09: qty 2

## 2024-04-09 MED ORDER — PIPERACILLIN-TAZOBACTAM 3.375 G IVPB: 3.3750 g | Freq: Three times a day (TID) | INTRAVENOUS | Status: DC

## 2024-04-09 MED ORDER — HYDROMORPHONE HCL 1 MG/ML IJ SOLN
1.0000 mg | INTRAMUSCULAR | Status: DC | PRN
  Administered 2024-04-09 – 2024-04-16 (×11): 1 mg via INTRAVENOUS
  Filled 2024-04-09 (×11): qty 1

## 2024-04-09 MED ORDER — ACETAMINOPHEN 325 MG PO TABS: 650.0000 mg | ORAL_TABLET | Freq: Four times a day (QID) | ORAL | Status: DC | PRN

## 2024-04-09 MED ORDER — PROCHLORPERAZINE EDISYLATE 10 MG/2ML IJ SOLN
5.0000 mg | INTRAMUSCULAR | Status: DC | PRN
Start: 2024-04-09 — End: 2024-04-12
  Administered 2024-04-12: 10 mg via INTRAVENOUS
  Filled 2024-04-09: qty 2

## 2024-04-09 MED ORDER — SODIUM CHLORIDE 0.9 % IV SOLN: INTRAVENOUS | Status: AC

## 2024-04-09 MED ORDER — PIPERACILLIN-TAZOBACTAM 3.375 G IVPB 30 MIN
3.3750 g | Freq: Once | INTRAVENOUS | Status: AC
  Administered 2024-04-09: 3.375 g via INTRAVENOUS

## 2024-04-09 MED ORDER — PHENOL 1.4 % MT LIQD: 2.0000 | OROMUCOSAL | Status: DC | PRN

## 2024-04-09 MED ORDER — ONDANSETRON HCL 4 MG/2ML IJ SOLN
4.0000 mg | Freq: Four times a day (QID) | INTRAMUSCULAR | Status: DC | PRN
  Administered 2024-04-10 – 2024-04-12 (×2): 4 mg via INTRAVENOUS
  Filled 2024-04-09 (×2): qty 2

## 2024-04-09 MED ORDER — LACTATED RINGERS IV BOLUS
1000.0000 mL | Freq: Once | INTRAVENOUS | Status: AC
  Administered 2024-04-09: 1000 mL via INTRAVENOUS

## 2024-04-09 MED ORDER — PIPERACILLIN-TAZOBACTAM 3.375 G IVPB 30 MIN
3.3750 g | Freq: Once | INTRAVENOUS | Status: DC
  Filled 2024-04-09: qty 50

## 2024-04-09 MED ORDER — MORPHINE SULFATE (PF) 2 MG/ML IV SOLN
2.0000 mg | Freq: Once | INTRAVENOUS | Status: AC
  Administered 2024-04-09: 2 mg via INTRAVENOUS
  Filled 2024-04-09: qty 1

## 2024-04-09 MED ORDER — POLYETHYLENE GLYCOL 3350 17 G PO PACK
17.0000 g | PACK | Freq: Every day | ORAL | Status: DC | PRN
Start: 2024-04-09 — End: 2024-04-14

## 2024-04-09 MED ORDER — PIPERACILLIN-TAZOBACTAM 3.375 G IVPB
3.3750 g | Freq: Three times a day (TID) | INTRAVENOUS | Status: DC
  Administered 2024-04-09 – 2024-04-13 (×12): 3.375 g via INTRAVENOUS
  Filled 2024-04-09 (×14): qty 50

## 2024-04-09 MED ORDER — ALUM & MAG HYDROXIDE-SIMETH 200-200-20 MG/5ML PO SUSP: 30.0000 mL | Freq: Four times a day (QID) | ORAL | Status: DC | PRN

## 2024-04-09 MED ORDER — MENTHOL 3 MG MT LOZG: 1.0000 | LOZENGE | OROMUCOSAL | Status: DC | PRN

## 2024-04-09 MED ORDER — METOPROLOL TARTRATE 5 MG/5ML IV SOLN: 5.0000 mg | Freq: Three times a day (TID) | INTRAVENOUS | Status: DC | PRN

## 2024-04-09 MED ORDER — MORPHINE SULFATE (PF) 4 MG/ML IV SOLN
4.0000 mg | Freq: Once | INTRAVENOUS | Status: AC
  Administered 2024-04-09: 4 mg via INTRAVENOUS
  Filled 2024-04-09: qty 1

## 2024-04-09 MED ORDER — LACTATED RINGERS IV BOLUS: 1000.0000 mL | Freq: Three times a day (TID) | INTRAVENOUS | Status: DC | PRN

## 2024-04-09 MED ORDER — ONDANSETRON HCL 4 MG/2ML IJ SOLN
4.0000 mg | Freq: Once | INTRAMUSCULAR | Status: AC
  Administered 2024-04-09: 4 mg via INTRAVENOUS
  Filled 2024-04-09: qty 2

## 2024-04-09 MED ORDER — SIMETHICONE 40 MG/0.6ML PO SUSP
80.0000 mg | Freq: Four times a day (QID) | ORAL | Status: DC | PRN
Start: 2024-04-09 — End: 2024-04-19

## 2024-04-09 MED ORDER — IOHEXOL 300 MG/ML  SOLN
80.0000 mL | Freq: Once | INTRAMUSCULAR | Status: AC | PRN
  Administered 2024-04-09: 80 mL via INTRAVENOUS

## 2024-04-09 MED ORDER — LACTATED RINGERS IV BOLUS: 1000.0000 mL | Freq: Once | INTRAVENOUS | Status: DC

## 2024-04-09 MED ORDER — PIPERACILLIN-TAZOBACTAM 3.375 G IVPB 30 MIN: 3.3750 g | Freq: Once | INTRAVENOUS | Status: DC

## 2024-04-09 MED ORDER — ACETAMINOPHEN 650 MG RE SUPP: 650.0000 mg | Freq: Four times a day (QID) | RECTAL | Status: DC | PRN

## 2024-04-09 NOTE — ED Notes (Signed)
Report given to [facility] 

## 2024-04-09 NOTE — ED Notes (Signed)
 Patient transported to CT

## 2024-04-09 NOTE — ED Triage Notes (Signed)
 Started having LLQ abdominal cramping constant since last night with diarrhea and vomiting.

## 2024-04-09 NOTE — ED Notes (Signed)
 ED Provider at bedside.

## 2024-04-09 NOTE — Progress Notes (Addendum)
 Plan of Care Note (Preliminary telephone/chart evaluation)      Summer Watkins  09/03/1950 [id number]  CARE TEAM:  PCP: [name], MD  Outpatient Care Team: Patient Care Team: [name], MD as PCP - General (Internal Medicine) Kristie Lamprey, MD as Consulting Physician (Gastroenterology) Toribio Alverna SAUNDERS., DO as Referring Physician (Cardiology) Murrell Kuba, MD as Consulting Physician (Orthopedic Surgery) Terri Alan PARAS, MD as Referring Physician (Otolaryngology)  Inpatient Treatment Team: Treatment Team:  Krishnan, Gokul, MD Rosana Grayce NOVAK, RN [facility], Md, MD Bobbette Ehrich, MD    Surgical advice by telephone at the request of Dr Neysa, [facility] ED.   Chief complaint / Reason for evaluation: Abdominal pain with dots of free air  73 year old woman.  Felt sharp lower abdominal pain.  Was concerned.  Went to [employer].  Not in shock and no fevers but elevated white count with abdominal discomfort.  CAT scan shows a few dots of free air in the upper abdomen but no definite perforated ulcer.  Surgical consultation requested   Assessment   Problem List:  Principal Problem:   Perforated viscus   Suspect this is diverticulitis given the lower abdominal pain.  NG tube decompression  IV fluid resuscitation.  Lactate barely elevated argues against any severe shock but follow closely.  IV antibiotics.  Usually favor piperacillin/tazobactam for broad strong intra-abdominal coverage  May need repeat CAT scan in with drainage of abscess formation or operative exploration if it does not improve quickly or worsens.  We will see.  Surgery will help follow.    Completed formal surgical consultation note pending arrival to hospital where surgeons are available.     Elspeth KYM Schultze, MD, FACS, MASCRS Esophageal, Gastrointestinal & Colorectal Surgery Robotic and Minimally Invasive Surgery  [facility] [address] [place], [place] [address] [phone] Fax [phone] Main  CONTACT INFORMATION: Weekday (9AM-5PM): Call [facility] main office at [phone] Weeknight (5PM-9AM) or Weekend/Holiday: Check EPIC Web Links tab & use AMION (password  [email or web address]) for General Surgery [facility] coverage  Please, DO NOT use SecureChat  (it is not reliable communication to reach operating surgeons & will lead to a delay in care).   Epic staff messaging available for outpatient concerns needing 1-2 business day response.      04/09/2024     ########################################################################################################    Past Medical History:  Diagnosis Date   Cardiomyopathy (HCC)    CHF (congestive heart failure) (HCC)    High blood pressure    Hyperlipidemia     Past Surgical History:  Procedure Laterality Date   PARTIAL HYSTERECTOMY  2002    Social History   Socioeconomic History   Marital status: Single    Spouse name: Not on file   Number of children: Not on file   Years of education: Not on file   Highest education level: Not on file  Occupational History   Not on file  Tobacco Use   Smoking status: Never   Smokeless tobacco: Never  Vaping Use   Vaping status: Never Used  Substance and Sexual Activity   Alcohol use: Never   Drug use: Never   Sexual activity: Not on file  Other Topics Concern   Not on file  Social History Narrative   Not on file   Social Drivers of Health   Financial Resource Strain: Not on file  Food Insecurity: Not on file  Transportation Needs: Not on file  Physical Activity: Not on file  Stress: Not on file  Social Connections: Not on file  Intimate Partner Violence: Not on file    Family History  Problem Relation Age of Onset   Stroke Mother    Hypertension Mother    Healthy Father     Current Facility-Administered Medications  Medication Dose Route Frequency Provider Last Rate  Last Admin   alum & mag hydroxide-simeth (MAALOX/MYLANTA) 200-200-20 MG/5ML suspension 30 mL  30 mL Oral Q6H PRN Sheldon Standing, MD       lactated ringers bolus 1,000 mL  1,000 mL Intravenous Q8H PRN Shoshannah Faubert, Standing, MD       lactated ringers bolus 1,000 mL  1,000 mL Intravenous Once Sheldon Standing, MD       magic mouthwash  15 mL Oral QID PRN Sheldon Standing, MD       menthol (CEPACOL) lozenge 3 mg  1 lozenge Oral PRN Sheldon Standing, MD       ondansetron (ZOFRAN) injection 4 mg  4 mg Intravenous Q6H PRN Sheldon Standing, MD       phenol (CHLORASEPTIC) mouth spray 2 spray  2 spray Mouth/Throat PRN Sheldon Standing, MD       piperacillin-tazobactam (ZOSYN) IVPB 3.375 g  3.375 g Intravenous Once Neysa Caron PARAS, DO 100 mL/hr at 04/09/24 1219 3.375 g at 04/09/24 1219   Followed by   piperacillin-tazobactam (ZOSYN) IVPB 3.375 g  3.375 g Intravenous Q8H Young, Travis J, DO       prochlorperazine (COMPAZINE) injection 5-10 mg  5-10 mg Intravenous Q4H PRN Sheldon Standing, MD       simethicone (MYLICON) 40 MG/0.6ML suspension 80 mg  80 mg Oral QID PRN Sheldon Standing, MD       Current Outpatient Medications  Medication Sig Dispense Refill   aspirin EC 81 MG tablet Take 81 mg by mouth.     carvedilol (COREG) 6.25 MG tablet Take 6.25 mg by mouth.     clotrimazole  (LOTRIMIN ) 1 % external solution Apply 1 application topically 2 (two) times daily. In between toes 60 mL 5   clotrimazole -betamethasone  (LOTRISONE ) cream Apply 1 application topically 2 (two) times daily. 30 g 0   diclofenac  Sodium (VOLTAREN ) 1 % GEL Apply 4 g topically 4 (four) times daily. 150 g 3   hydrochlorothiazide (MICROZIDE) 12.5 MG capsule Take by mouth.     hydrocortisone  2.5 % cream Apply topically.     hydrocortisone  2.5 % cream APPLY TOPICALLY TWICE A DAY 28 g 0   hydrOXYzine (ATARAX/VISTARIL) 25 MG tablet Take by mouth.     ibuprofen  (ADVIL ) 800 MG tablet TAKE 1 TABLET (800 MG TOTAL) BY MOUTH IN THE MORNING AND AT BEDTIME. 30 tablet 0    meloxicam (MOBIC) 7.5 MG tablet meloxicam 7.5 mg tablet  TAKE 1 TABLET (7.5 MG TOTAL) BY MOUTH DAILY. TAKE WITH FOOD     nitrofurantoin, macrocrystal-monohydrate, (MACROBID) 100 MG capsule nitrofurantoin monohydrate/macrocrystals 100 mg capsule  TAKE 1 CAPSULE BY MOUTH EVERY 12 HOURS FOR 7 DAYS     ramipril (ALTACE) 5 MG capsule ramipril 5 mg capsule  TAKE 1 CAPSULE BY MOUTH EVERY DAY     rosuvastatin  (CRESTOR ) 20 MG tablet One tab po on Monday, Wednesday, Friday 45 tablet 1   sulfamethoxazole-trimethoprim (BACTRIM DS) 800-160 MG tablet sulfamethoxazole 800 mg-trimethoprim 160 mg tablet  TAKE 1 TABLET BY MOUTH TWICE A DAY 3FD     Vitamin D , Ergocalciferol , (DRISDOL) 1.25 MG (50000 UNIT) CAPS capsule TAKE 1 CAPSULE BY MOUTH  ONE TIME PER WEEK 12 capsule 0     No Known Allergies   BP 126/74   Pulse 100   Temp 98.2 F (36.8 C)   Resp 18   Ht 5' 2 (1.575 m)   Wt 83.9 kg   SpO2 97%   BMI 33.84 kg/m     Results:   Labs: Results for orders placed or performed during the hospital encounter of 04/09/24 (from the past 48 hours)  Lipase, blood     Status: Abnormal   Collection Time: 04/09/24  9:38 AM  Result Value Ref Range   Lipase <10 (L) 11 - 51 U/L    Comment: Performed at [facility], [address] [place], [place] [address]  Comprehensive metabolic panel     Status: Abnormal   Collection Time: 04/09/24  9:38 AM  Result Value Ref Range   Sodium 139 135 - 145 mmol/L   Potassium 4.5 3.5 - 5.1 mmol/L   Chloride 103 98 - 111 mmol/L   CO2 23 22 - 32 mmol/L   Glucose, Bld 177 (H) 70 - 99 mg/dL    Comment: Glucose reference range applies only to samples taken after fasting for at least 8 hours.   BUN 25 (H) 8 - 23 mg/dL   Creatinine, Ser 9.08 0.44 - 1.00 mg/dL   Calcium  10.3 8.9 - 10.3 mg/dL   Total Protein 6.5 6.5 - 8.1 g/dL   Albumin 3.9 3.5 - 5.0 g/dL   AST 18 15 - 41 U/L   ALT 24 0 - 44 U/L   Alkaline Phosphatase 65 38 - 126 U/L   Total Bilirubin 0.9 0.0 -  1.2 mg/dL   GFR, Estimated >39 >39 mL/min    Comment: (NOTE) Calculated using the CKD-EPI Creatinine Equation (2021)    Anion gap 13 5 - 15    Comment: Performed at [facility], [address] [place], [place] [address]  CBC     Status: Abnormal   Collection Time: 04/09/24  9:38 AM  Result Value Ref Range   WBC 17.6 (H) 4.0 - 10.5 K/uL   RBC 4.51 3.87 - 5.11 MIL/uL   Hemoglobin 12.8 12.0 - 15.0 g/dL   HCT 61.0 63.9 - 53.9 %   MCV 86.3 80.0 - 100.0 fL   MCH 28.4 26.0 - 34.0 pg   MCHC 32.9 30.0 - 36.0 g/dL   RDW 85.7 88.4 - 84.4 %   Platelets 309 150 - 400 K/uL   nRBC 0.0 0.0 - 0.2 %    Comment: Performed at [facility], [address] [place], [place] [address]  Lactic acid, plasma     Status: Abnormal   Collection Time: 04/09/24  9:38 AM  Result Value Ref Range   Lactic Acid, Venous 2.1 (HH) 0.5 - 1.9 mmol/L    Comment: Critical Value, Read Back and verified with ERLA HASTE RN [phone] 1020 BY CHRISTELLA HOLIDAY Performed at [facility], [address] [place], [place] [address]   Lactic acid, plasma     Status: Abnormal   Collection Time: 04/09/24 11:24 AM  Result Value Ref Range   Lactic Acid, Venous 2.6 (HH) 0.5 - 1.9 mmol/L    Comment: Critical Value, Read Back and verified with DORTHEA BURKITT, RN [phone] 1221 BY CHRISTELLA HOLIDAY Performed at [facility], [address] [place], [place] [address]     Imaging / Studies: CT ABDOMEN PELVIS  W CONTRAST Result Date: 04/09/2024 CLINICAL DATA:  Bowel obstruction suspected Diverticulitis, complication suspected EXAM: CT ABDOMEN AND PELVIS WITH CONTRAST TECHNIQUE: Multidetector CT imaging of the abdomen and pelvis was performed using the standard protocol following bolus administration of intravenous contrast. RADIATION DOSE REDUCTION: This exam was performed according to the departmental dose-optimization program which includes automated exposure control, adjustment of the mA and/or kV  according to patient size and/or use of iterative reconstruction technique. CONTRAST:  80mL OMNIPAQUE IOHEXOL 300 MG/ML  SOLN COMPARISON:  None Available. FINDINGS: Lower chest: No acute abnormality. Hepatobiliary: No density of the liver which could reflect underlying hepatic steatosis. More focal fatty deposition along the falciform ligament. Gallbladder is unremarkable. No extrahepatic biliary ductal dilation. Pancreas: Unremarkable. No pancreatic ductal dilatation or surrounding inflammatory changes. Spleen: Normal in size without focal abnormality. Adrenals/Urinary Tract: Kidneys enhance symmetrically. Adrenal glands are unremarkable. No hydronephrosis. No obstructing nephrolithiasis. Bladder is decompressed. Stomach/Bowel: There is a small amount of pneumoperitoneum overlying the liver and centered in the upper abdomen. In the LEFT lower quadrant, there are several adjacent loops of small bowel which demonstrate bowel wall thickening and some intervening areas of fat stranding and trace amount of fluid. Diverticulosis of the sigmoid colon. There is some mild fat stranding along the sigmoid colon without bowel wall thickening; this is in the region of the [facility] loops of small bowel in the LEFT lower quadrant. Moderate predominately RIGHT-sided colonic stool burden. Small hiatal hernia. Appendix is normal. Vascular/Lymphatic: Atherosclerotic calcifications. No suspicious adenopathy noted. Reproductive: Status post hysterectomy. 28 mm LEFT ovarian low-density mass measuring slightly above fluid density. Other: No focal drainable fluid collection. Musculoskeletal: No acute or significant osseous findings. IMPRESSION: 1. There is a small amount of pneumoperitoneum centered in the upper abdomen. There are several adherent loops of small bowel in the LEFT lower quadrant which demonstrate bowel wall thickening and some intervening areas of fat stranding and trace amount of fluid. Findings are concerning for perforated  viscus, likely secondary to a nonspecific infectious or inflammatory small bowel enteritis versus sequela of ingestion of foreign body. 2. There is some mild fat stranding along the sigmoid colon which is favored to be reactive to adjacent enteritis. No definitive acute diverticulitis. 3. 28 mm LEFT ovarian low-density mass measuring slightly above fluid density. Recommend further evaluation with nonemergent outpatient pelvic ultrasound. Aortic Atherosclerosis (ICD10-I70.0). These results were called by telephone at the time of interpretation on 04/09/2024 at 11:03 am to Dr. CARON SALT , who verbally acknowledged these results. Electronically Signed   By: Corean Salter M.D.   On: 04/09/2024 11:06    Medications / Allergies: per chart  Antibiotics: Anti-infectives (From admission, onward)    Start     Dose/Rate Route Frequency Ordered Stop   04/09/24 1800  piperacillin-tazobactam (ZOSYN) IVPB 3.375 g  Status:  Discontinued       Placed in Followed by Linked Group   3.375 g 12.5 mL/hr over 240 Minutes Intravenous Every 8 hours 04/09/24 1109 04/09/24 1123   04/09/24 1800  piperacillin-tazobactam (ZOSYN) IVPB 3.375 g       Placed in Followed by Linked Group   3.375 g 12.5 mL/hr over 240 Minutes Intravenous Every 8 hours 04/09/24 1201     04/09/24 1215  piperacillin-tazobactam (ZOSYN) IVPB 3.375 g  Status:  Discontinued        3.375 g 100 mL/hr over 30 Minutes Intravenous  Once 04/09/24 1200 04/09/24 1201   04/09/24 1215  piperacillin-tazobactam (ZOSYN) IVPB 3.375 g  Placed in Followed by Linked Group   3.375 g 100 mL/hr over 30 Minutes Intravenous  Once 04/09/24 1201     04/09/24 1115  piperacillin-tazobactam (ZOSYN) IVPB 3.375 g  Status:  Discontinued       Placed in Followed by Linked Group   3.375 g 100 mL/hr over 30 Minutes Intravenous  Once 04/09/24 1109 04/09/24 1123         Note: Portions of this report may have been transcribed using voice recognition  software. Every effort was made to ensure accuracy; however, inadvertent computerized transcription errors may be present.   Any transcriptional errors that result from this process are unintentional.      04/09/2024  12:31 PM

## 2024-04-09 NOTE — ED Notes (Signed)
 EDP verified placement once xray complete

## 2024-04-09 NOTE — ED Notes (Signed)
 Family at bedside. Pt and family are aware of pending discharge to [facility]

## 2024-04-09 NOTE — ED Provider Notes (Signed)
 [place] [facility] Provider Note   CSN: [id number] Arrival date & time: 04/09/24  9091     Patient presents with: Abdominal Pain   Summer Watkins is a 72 y.o. female.   This is a 73 year old female presenting emergency department for abdominal pain.  Symptoms started abruptly last night.  Largely left lower quadrant, but notes that across entire lower abdomen.  Associated with nausea vomiting diarrhea.  No blood in stool or vomit.  Has had a hysterectomy, but no other surgeries.  Reports pain 10 out of 10.   Abdominal Pain      Prior to Admission medications   Medication Sig Start Date End Date Taking? Authorizing Provider  aspirin EC 81 MG tablet Take 81 mg by mouth.    [provider]  carvedilol (COREG) 6.25 MG tablet Take 6.25 mg by mouth. 02/24/17   [provider]  clotrimazole  (LOTRIMIN ) 1 % external solution Apply 1 application topically 2 (two) times daily. In between toes 06/06/20   Stover, Titorya, DPM  clotrimazole -betamethasone  (LOTRISONE ) cream Apply 1 application topically 2 (two) times daily. 06/06/20   Stover, Titorya, DPM  diclofenac  Sodium (VOLTAREN ) 1 % GEL Apply 4 g topically 4 (four) times daily. 07/18/19   Stover, Titorya, DPM  hydrochlorothiazide (MICROZIDE) 12.5 MG capsule Take by mouth.    [provider]  hydrocortisone  2.5 % cream Apply topically. 05/17/20   [provider]  hydrocortisone  2.5 % cream APPLY TOPICALLY TWICE A DAY 10/04/20   Stover, Titorya, DPM  hydrOXYzine (ATARAX/VISTARIL) 25 MG tablet Take by mouth. 02/27/16   [provider]  ibuprofen  (ADVIL ) 800 MG tablet TAKE 1 TABLET (800 MG TOTAL) BY MOUTH IN THE MORNING AND AT BEDTIME. 01/08/21   Stover, Titorya, DPM  meloxicam (MOBIC) 7.5 MG tablet meloxicam 7.5 mg tablet  TAKE 1 TABLET (7.5 MG TOTAL) BY MOUTH DAILY. TAKE WITH FOOD    [provider]  nitrofurantoin, macrocrystal-monohydrate, (MACROBID) 100  MG capsule nitrofurantoin monohydrate/macrocrystals 100 mg capsule  TAKE 1 CAPSULE BY MOUTH EVERY 12 HOURS FOR 7 DAYS    [provider]  ramipril (ALTACE) 5 MG capsule ramipril 5 mg capsule  TAKE 1 CAPSULE BY MOUTH EVERY DAY 10/06/16   [provider]  rosuvastatin  (CRESTOR ) 20 MG tablet One tab po on Monday, Wednesday, Friday 04/24/20   [name], MD  sulfamethoxazole-trimethoprim (BACTRIM DS) 800-160 MG tablet sulfamethoxazole 800 mg-trimethoprim 160 mg tablet  TAKE 1 TABLET BY MOUTH TWICE A DAY 3FD    [provider]  Vitamin D , Ergocalciferol , (DRISDOL) 1.25 MG (50000 UNIT) CAPS capsule TAKE 1 CAPSULE BY MOUTH ONE TIME PER WEEK 02/24/21   [name], MD    Allergies: Patient has no known allergies.    Review of Systems  Gastrointestinal:  Positive for abdominal pain.    Updated Vital Signs BP 109/62   Pulse (!) 105   Temp 97.8 F (36.6 C) (Oral)   Resp (!) 25   Ht 5' 2 (1.575 m)   Wt 90.5 kg   SpO2 96%   BMI 36.51 kg/m   Physical Exam Vitals and nursing note reviewed.  Constitutional:      General: She is not in acute distress.    Appearance: She is not toxic-appearing.  HENT:     Head: Normocephalic.  Cardiovascular:     Rate and Rhythm: Normal rate and regular rhythm.  Pulmonary:     Effort: Pulmonary effort is normal.  Breath sounds: Normal breath sounds.  Abdominal:     General: Abdomen is flat.     Palpations: Abdomen is soft.     Tenderness: There is abdominal tenderness in the right lower quadrant, epigastric area, periumbilical area, suprapubic area, left upper quadrant and left lower quadrant. There is no guarding or rebound.  Skin:    General: Skin is warm.     Capillary Refill: Capillary refill takes less than 2 seconds.  Neurological:     Mental Status: She is alert and oriented to person, place, and time.  Psychiatric:        Mood and Affect: Mood normal.        Behavior: Behavior normal.     (all labs  ordered are listed, but only abnormal results are displayed) Labs Reviewed  LIPASE, BLOOD - Abnormal; Notable for the following components:      Result Value   Lipase <10 (*)    All other components within normal limits  COMPREHENSIVE METABOLIC PANEL WITH GFR - Abnormal; Notable for the following components:   Glucose, Bld 177 (*)    BUN 25 (*)    All other components within normal limits  CBC - Abnormal; Notable for the following components:   WBC 17.6 (*)    All other components within normal limits  LACTIC ACID, PLASMA - Abnormal; Notable for the following components:   Lactic Acid, Venous 2.1 (*)    All other components within normal limits  LACTIC ACID, PLASMA - Abnormal; Notable for the following components:   Lactic Acid, Venous 2.6 (*)    All other components within normal limits  URINALYSIS, ROUTINE W REFLEX MICROSCOPIC    EKG: None  Radiology: DG Abd Portable 1V Result Date: 04/09/2024 EXAM: 1 VIEW XRAY OF THE ABDOMEN 04/09/2024 12:23:19 PM COMPARISON: None available. CLINICAL HISTORY: Encounter for nasogastric (NG) tube placement FINDINGS: LINES, TUBES AND DEVICES: Nasogastric tube terminates overthe gastric body with side port not well visualized. BOWEL: No gross free intraperitoneal air. BONES: No acute osseous abnormality. LOWER CHEST: Cardiomegaly. IMPRESSION: 1. Nasogastric terminating over the  gastric body. Electronically signed by: Rockey Kilts MD 04/09/2024 12:55 PM EST RP Workstation: [id number]   CT ABDOMEN PELVIS W CONTRAST Result Date: 04/09/2024 CLINICAL DATA:  Bowel obstruction suspected Diverticulitis, complication suspected EXAM: CT ABDOMEN AND PELVIS WITH CONTRAST TECHNIQUE: Multidetector CT imaging of the abdomen and pelvis was performed using the standard protocol following bolus administration of intravenous contrast. RADIATION DOSE REDUCTION: This exam was performed according to the departmental dose-optimization program which includes automated exposure  control, adjustment of the mA and/or kV according to patient size and/or use of iterative reconstruction technique. CONTRAST:  80mL OMNIPAQUE IOHEXOL 300 MG/ML  SOLN COMPARISON:  None Available. FINDINGS: Lower chest: No acute abnormality. Hepatobiliary: No density of the liver which could reflect underlying hepatic steatosis. More focal fatty deposition along the falciform ligament. Gallbladder is unremarkable. No extrahepatic biliary ductal dilation. Pancreas: Unremarkable. No pancreatic ductal dilatation or surrounding inflammatory changes. Spleen: Normal in size without focal abnormality. Adrenals/Urinary Tract: Kidneys enhance symmetrically. Adrenal glands are unremarkable. No hydronephrosis. No obstructing nephrolithiasis. Bladder is decompressed. Stomach/Bowel: There is a small amount of pneumoperitoneum overlying the liver and centered in the upper abdomen. In the LEFT lower quadrant, there are several adjacent loops of small bowel which demonstrate bowel wall thickening and some intervening areas of fat stranding and trace amount of fluid. Diverticulosis of the sigmoid colon. There is some mild fat stranding along the sigmoid colon without  bowel wall thickening; this is in the region of the [facility] loops of small bowel in the LEFT lower quadrant. Moderate predominately RIGHT-sided colonic stool burden. Small hiatal hernia. Appendix is normal. Vascular/Lymphatic: Atherosclerotic calcifications. No suspicious adenopathy noted. Reproductive: Status post hysterectomy. 28 mm LEFT ovarian low-density mass measuring slightly above fluid density. Other: No focal drainable fluid collection. Musculoskeletal: No acute or significant osseous findings. IMPRESSION: 1. There is a small amount of pneumoperitoneum centered in the upper abdomen. There are several adherent loops of small bowel in the LEFT lower quadrant which demonstrate bowel wall thickening and some intervening areas of fat stranding and trace amount of  fluid. Findings are concerning for perforated viscus, likely secondary to a nonspecific infectious or inflammatory small bowel enteritis versus sequela of ingestion of foreign body. 2. There is some mild fat stranding along the sigmoid colon which is favored to be reactive to adjacent enteritis. No definitive acute diverticulitis. 3. 28 mm LEFT ovarian low-density mass measuring slightly above fluid density. Recommend further evaluation with nonemergent outpatient pelvic ultrasound. Aortic Atherosclerosis (ICD10-I70.0). These results were called by telephone at the time of interpretation on 04/09/2024 at 11:03 am to Dr. CARON SALT , who verbally acknowledged these results. Electronically Signed   By: Corean Salter M.D.   On: 04/09/2024 11:06     .Critical Care  Performed by: Salt Caron PARAS, DO Authorized by: Salt Caron PARAS, DO   Critical care provider statement:    Critical care time (minutes):  30   Critical care was necessary to treat or prevent imminent or life-threatening deterioration of the following conditions: Perforated viscus.   Critical care was time spent personally by me on the following activities:  Development of treatment plan with patient or surrogate, discussions with consultants, evaluation of patient's response to treatment, examination of patient, ordering and review of laboratory studies, ordering and review of radiographic studies, ordering and performing treatments and interventions, pulse oximetry, re-evaluation of patient's condition and review of old charts    Medications Ordered in the ED  lactated ringers bolus 1,000 mL (has no administration in time range)  ondansetron (ZOFRAN) injection 4 mg (has no administration in time range)  prochlorperazine (COMPAZINE) injection 5-10 mg (has no administration in time range)  phenol (CHLORASEPTIC) mouth spray 2 spray (has no administration in time range)  menthol (CEPACOL) lozenge 3 mg (has no administration in time  range)  magic mouthwash (has no administration in time range)  alum & mag hydroxide-simeth (MAALOX/MYLANTA) 200-200-20 MG/5ML suspension 30 mL (has no administration in time range)  simethicone (MYLICON) 40 MG/0.6ML suspension 80 mg (has no administration in time range)  piperacillin-tazobactam (ZOSYN) IVPB 3.375 g (0 g Intravenous Stopped 04/09/24 1302)    Followed by  piperacillin-tazobactam (ZOSYN) IVPB 3.375 g (has no administration in time range)  lactated ringers bolus 1,000 mL (1,000 mLs Intravenous Not Given 04/09/24 1331)  morphine (PF) 2 MG/ML injection 2 mg (2 mg Intravenous Given 04/09/24 0943)  ondansetron (ZOFRAN) injection 4 mg (4 mg Intravenous Given 04/09/24 0940)  iohexol (OMNIPAQUE) 300 MG/ML solution 80 mL (80 mLs Intravenous Contrast Given 04/09/24 1033)  morphine (PF) 2 MG/ML injection 2 mg (2 mg Intravenous Given 04/09/24 1028)  lactated ringers bolus 1,000 mL (0 mLs Intravenous Stopped 04/09/24 1323)  morphine (PF) 4 MG/ML injection 4 mg (4 mg Intravenous Given 04/09/24 1150)    Clinical Course as of 04/09/24 1523  Sun Apr 09, 2024  1059 CT ABDOMEN PELVIS W CONTRAST Do not appreciate free air  on my independent review of images [TY]  1117 Spoke with Dr. Sheldon with surgery; recommending IV antibiotics thinks symptoms may be secondary to diverticulitis recommending medicine admission and will see in consult. [TY]  1154 Spoke with hospitalist for admission.  Agrees to admit to [name] [TY]    Clinical Course User Index [TY] Neysa Caron PARAS, DO                                 Medical Decision Making This is a 73 year old female past medical history to include CHF, obesity, hypertension, hyperlipidemia presented to the emergency department for abdominal pain.  Surgical history includes partial hysterectomy in 2002.  Vital signs are reassuring.  Physical exam does not appear to be overt distress despite saying she is having 10/10 pain.  Her abdomen is soft, it is diffusely  tender, but no rebound or guarding.  Will give IV pain medications, Zofran for nausea.  Will hold off on IV fluids given her CHF history.  Will get abdominal screening labs and CT scan to evaluate for diverticulitis versus obstruction versus colitis/gastroenteritis.  Amount and/or Complexity of Data Reviewed Independent Historian:     Details: Family at bedside, note low suspicion for foreign body ingestion date ate boneless pork chops last night External Data Reviewed:     Details: Her last EF from an echo in August was 30 to 35% Labs: ordered. Decision-making details documented in ED Course.    Details: Has a elevated leukocytosis and elevated lactate.  Started on Zosyn given CT findings for perforated viscus. Radiology: ordered and independent interpretation performed. Decision-making details documented in ED Course.    Details: Free air in the upper quadrants. Discussion of management or test interpretation with external provider(s): Dr. Sheldon with surgery; see ED course.  Hospitalist for admission  Risk Prescription drug management. Parenteral controlled substances. Decision regarding hospitalization. Diagnosis or treatment significantly limited by social determinants of health. Risk Details: Poor health literacy       Final diagnoses:  Free intraperitoneal air    ED Discharge Orders     None          Neysa Caron PARAS, DO 04/09/24 1523

## 2024-04-09 NOTE — Consult Note (Signed)
 Kamilla Hands 1950/09/06  [id number].    Requesting MD: Lebron Cage Chief Complaint/Reason for Consult: abdominal pain  HPI:  73 yo female with 1 day of abdominal pain. She had multiple episodes of vomiting and diarrhea yesterday. She went to the ED for worsening pain. She has not had similar episodes in the past. Pain is constant in the left lower quadrant.  ROS: Review of Systems  Gastrointestinal:  Positive for abdominal pain, diarrhea, nausea and vomiting.  All other systems reviewed and are negative.   Family History  Problem Relation Age of Onset   Stroke Mother    Hypertension Mother    Healthy Father     Past Medical History:  Diagnosis Date   Cardiomyopathy (HCC)    CHF (congestive heart failure) (HCC)    High blood pressure    Hyperlipidemia     Past Surgical History:  Procedure Laterality Date   PARTIAL HYSTERECTOMY  2002    Social History:  reports that she has never smoked. She has never used smokeless tobacco. She reports that she does not drink alcohol and does not use drugs.  Allergies: No Known Allergies  Medications Prior to Admission  Medication Sig Dispense Refill   aspirin EC 81 MG tablet Take 81 mg by mouth.     carvedilol (COREG) 6.25 MG tablet Take 6.25 mg by mouth.     clotrimazole  (LOTRIMIN ) 1 % external solution Apply 1 application topically 2 (two) times daily. In between toes 60 mL 5   clotrimazole -betamethasone  (LOTRISONE ) cream Apply 1 application topically 2 (two) times daily. 30 g 0   diclofenac  Sodium (VOLTAREN ) 1 % GEL Apply 4 g topically 4 (four) times daily. 150 g 3   hydrochlorothiazide (MICROZIDE) 12.5 MG capsule Take by mouth.     hydrocortisone  2.5 % cream Apply topically.     hydrocortisone  2.5 % cream APPLY TOPICALLY TWICE A DAY 28 g 0   hydrOXYzine (ATARAX/VISTARIL) 25 MG tablet Take by mouth.     ibuprofen  (ADVIL ) 800 MG tablet TAKE 1 TABLET (800 MG TOTAL) BY MOUTH IN THE MORNING AND AT BEDTIME. 30 tablet  0   meloxicam (MOBIC) 7.5 MG tablet meloxicam 7.5 mg tablet  TAKE 1 TABLET (7.5 MG TOTAL) BY MOUTH DAILY. TAKE WITH FOOD     nitrofurantoin, macrocrystal-monohydrate, (MACROBID) 100 MG capsule nitrofurantoin monohydrate/macrocrystals 100 mg capsule  TAKE 1 CAPSULE BY MOUTH EVERY 12 HOURS FOR 7 DAYS     ramipril (ALTACE) 5 MG capsule ramipril 5 mg capsule  TAKE 1 CAPSULE BY MOUTH EVERY DAY     rosuvastatin  (CRESTOR ) 20 MG tablet One tab po on Monday, Wednesday, Friday 45 tablet 1   sulfamethoxazole-trimethoprim (BACTRIM DS) 800-160 MG tablet sulfamethoxazole 800 mg-trimethoprim 160 mg tablet  TAKE 1 TABLET BY MOUTH TWICE A DAY 3FD     Vitamin D , Ergocalciferol , (DRISDOL) 1.25 MG (50000 UNIT) CAPS capsule TAKE 1 CAPSULE BY MOUTH ONE TIME PER WEEK 12 capsule 0    Physical Exam: Blood pressure 109/62, pulse (!) 105, temperature 97.8 F (36.6 C), temperature source Oral, resp. rate (!) 25, height 5' 2 (1.575 m), weight 90.5 kg, SpO2 96%. Gen: NAd Resp: nonlabored CV: tachycardic Abd: soft, tender to palpation in left lower quadrant Neuro: AOx4  Results for orders placed or performed during the hospital encounter of 04/09/24 (from the past 48 hours)  Lipase, blood     Status: Abnormal   Collection Time: 04/09/24  9:38 AM  Result Value Ref Range  Lipase <10 (L) 11 - 51 U/L    Comment: Performed at [facility], [address] [place], [place] [address]  Comprehensive metabolic panel     Status: Abnormal   Collection Time: 04/09/24  9:38 AM  Result Value Ref Range   Sodium 139 135 - 145 mmol/L   Potassium 4.5 3.5 - 5.1 mmol/L   Chloride 103 98 - 111 mmol/L   CO2 23 22 - 32 mmol/L   Glucose, Bld 177 (H) 70 - 99 mg/dL    Comment: Glucose reference range applies only to samples taken after fasting for at least 8 hours.   BUN 25 (H) 8 - 23 mg/dL   Creatinine, Ser 9.08 0.44 - 1.00 mg/dL   Calcium  10.3 8.9 - 10.3 mg/dL   Total Protein 6.5 6.5 - 8.1 g/dL   Albumin 3.9 3.5 -  5.0 g/dL   AST 18 15 - 41 U/L   ALT 24 0 - 44 U/L   Alkaline Phosphatase 65 38 - 126 U/L   Total Bilirubin 0.9 0.0 - 1.2 mg/dL   GFR, Estimated >39 >39 mL/min    Comment: (NOTE) Calculated using the CKD-EPI Creatinine Equation (2021)    Anion gap 13 5 - 15    Comment: Performed at [facility], [address] [place], [place] [address]  CBC     Status: Abnormal   Collection Time: 04/09/24  9:38 AM  Result Value Ref Range   WBC 17.6 (H) 4.0 - 10.5 K/uL   RBC 4.51 3.87 - 5.11 MIL/uL   Hemoglobin 12.8 12.0 - 15.0 g/dL   HCT 61.0 63.9 - 53.9 %   MCV 86.3 80.0 - 100.0 fL   MCH 28.4 26.0 - 34.0 pg   MCHC 32.9 30.0 - 36.0 g/dL   RDW 85.7 88.4 - 84.4 %   Platelets 309 150 - 400 K/uL   nRBC 0.0 0.0 - 0.2 %    Comment: Performed at [facility], [address] [place], [place] [address]  Lactic acid, plasma     Status: Abnormal   Collection Time: 04/09/24  9:38 AM  Result Value Ref Range   Lactic Acid, Venous 2.1 (HH) 0.5 - 1.9 mmol/L    Comment: Critical Value, Read Back and verified with ERLA HASTE RN [phone] 1020 BY CHRISTELLA HOLIDAY Performed at [facility], [address] [place], [place] [address]   Lactic acid, plasma     Status: Abnormal   Collection Time: 04/09/24 11:24 AM  Result Value Ref Range   Lactic Acid, Venous 2.6 (HH) 0.5 - 1.9 mmol/L    Comment: Critical Value, Read Back and verified with DORTHEA BURKITT, RN [phone] 1221 BY CHRISTELLA HOLIDAY Performed at [facility], [address] [place], [place] [address]    DG Abd Portable 1V Result Date: 04/09/2024 EXAM: 1 VIEW XRAY OF THE ABDOMEN 04/09/2024 12:23:19 PM COMPARISON: None available. CLINICAL HISTORY: Encounter for nasogastric (NG) tube placement FINDINGS: LINES, TUBES AND DEVICES: Nasogastric tube terminates overthe gastric body with side port not well visualized. BOWEL: No gross free intraperitoneal air. BONES: No acute osseous abnormality. LOWER CHEST: Cardiomegaly.  IMPRESSION: 1. Nasogastric terminating over the  gastric body. Electronically signed by: Rockey Kilts MD 04/09/2024 12:55 PM EST RP Workstation: [id number]   CT ABDOMEN PELVIS W CONTRAST Result Date: 04/09/2024 CLINICAL DATA:  Bowel obstruction suspected Diverticulitis, complication suspected EXAM: CT ABDOMEN AND PELVIS WITH CONTRAST TECHNIQUE: Multidetector  CT imaging of the abdomen and pelvis was performed using the standard protocol following bolus administration of intravenous contrast. RADIATION DOSE REDUCTION: This exam was performed according to the departmental dose-optimization program which includes automated exposure control, adjustment of the mA and/or kV according to patient size and/or use of iterative reconstruction technique. CONTRAST:  80mL OMNIPAQUE IOHEXOL 300 MG/ML  SOLN COMPARISON:  None Available. FINDINGS: Lower chest: No acute abnormality. Hepatobiliary: No density of the liver which could reflect underlying hepatic steatosis. More focal fatty deposition along the falciform ligament. Gallbladder is unremarkable. No extrahepatic biliary ductal dilation. Pancreas: Unremarkable. No pancreatic ductal dilatation or surrounding inflammatory changes. Spleen: Normal in size without focal abnormality. Adrenals/Urinary Tract: Kidneys enhance symmetrically. Adrenal glands are unremarkable. No hydronephrosis. No obstructing nephrolithiasis. Bladder is decompressed. Stomach/Bowel: There is a small amount of pneumoperitoneum overlying the liver and centered in the upper abdomen. In the LEFT lower quadrant, there are several adjacent loops of small bowel which demonstrate bowel wall thickening and some intervening areas of fat stranding and trace amount of fluid. Diverticulosis of the sigmoid colon. There is some mild fat stranding along the sigmoid colon without bowel wall thickening; this is in the region of the [facility] loops of small bowel in the LEFT lower quadrant. Moderate predominately RIGHT-sided  colonic stool burden. Small hiatal hernia. Appendix is normal. Vascular/Lymphatic: Atherosclerotic calcifications. No suspicious adenopathy noted. Reproductive: Status post hysterectomy. 28 mm LEFT ovarian low-density mass measuring slightly above fluid density. Other: No focal drainable fluid collection. Musculoskeletal: No acute or significant osseous findings. IMPRESSION: 1. There is a small amount of pneumoperitoneum centered in the upper abdomen. There are several adherent loops of small bowel in the LEFT lower quadrant which demonstrate bowel wall thickening and some intervening areas of fat stranding and trace amount of fluid. Findings are concerning for perforated viscus, likely secondary to a nonspecific infectious or inflammatory small bowel enteritis versus sequela of ingestion of foreign body. 2. There is some mild fat stranding along the sigmoid colon which is favored to be reactive to adjacent enteritis. No definitive acute diverticulitis. 3. 28 mm LEFT ovarian low-density mass measuring slightly above fluid density. Recommend further evaluation with nonemergent outpatient pelvic ultrasound. Aortic Atherosclerosis (ICD10-I70.0). These results were called by telephone at the time of interpretation on 04/09/2024 at 11:03 am to Dr. CARON SALT , who verbally acknowledged these results. Electronically Signed   By: Corean Salter M.D.   On: 04/09/2024 11:06    Assessment/Plan 73 yo female with abdominal pain. I reviewed the Ct scan images showing inflammatory changes around the sigmoid colon as well as a loop of jejunum in the left lower quadrant. There is a small amount of free air around the liver. On follow up XR NG is in good position, no dilated loops of small intestine seen, and no free air seen. I think this is most likely diverticulitis of the sigmoid colon with localized perforation with concern for small abscess and distal free air.  -I discussed treatment plan of bowel rest and  antibiotics, serial exams, serial labs and clinical assessment. I discussed a 10-20% need for surgery if worsens or fails to improve that could results in bowel resection and ostomy creation. She showed good understanding and all questions were answered. I discussed the care plan with Dr. Donnamarie   FEN - NPO, meds ok, NG tube in position VTE - lovenox ID - zosyn Admit - inpatient  I reviewed last 24 h vitals and pain scores, last 48 h  intake and output, last 24 h labs and trends, and last 24 h imaging results.  Herlene Righter [facility] Surgery 04/09/2024, 4:22 PM Please see Amion for pager number during day hours 7:00am-4:30pm or 7:00am -11:30am on weekends

## 2024-04-09 NOTE — H&P (Signed)
 History and Physical    Patient: Summer Watkins FMW:[id number] DOB: 12/11/1950 DOA: 04/09/2024 DOS: the patient was seen and examined on 04/09/2024 PCP: [name], MD  Patient coming from: [facility] ED  Chief Complaint:  Chief Complaint  Patient presents with   Abdominal Pain   HPI: Summer Watkins is a 73 y.o. female with medical history significant of HFrEF, hypertension, hyperlipidemia, prediabetes, presents to [facility] ED complaining of significant left lower quadrant abdominal pain, nausea/vomiting, diarrhea for 1 day, which started on 04/08/2024.  Patient describes abdominal pain as sharp, sudden onset, no radiation.  Reported associated nausea with vomiting recently ingested food, denies seeing any blood.  Reported 1 episode of loose stool.  Denies ever having similar symptoms in the past.  Patient denies any chest pain, shortness of breath, fever/chills, dizziness, diaphoresis.  In [facility] ED, initial vital signs otherwise stable, currently now with some tachycardia.  Labs showed WBC 17.6, noted lactic acidosis 2.1-2.6, otherwise unremarkable.  CT abdomen/pelvis showed possible left lower quadrant bowel wall thickening with some small amount of pneumoperitoneum with findings concerning for perforated viscus likely 2/2 small bowel enteritis.  Patient was given IV morphine, IV fluid and started on IV Zosyn.  Patient was made n.p.o. and an NG tube was placed.  General surgery consulted.  [facility] hospitalist consulted for admission and further management.    Review of Systems: As mentioned in the history of present illness. All other systems reviewed and are negative.    Past Medical History:  Diagnosis Date   Cardiomyopathy (HCC)    CHF (congestive heart failure) (HCC)    High blood pressure    Hyperlipidemia    Past Surgical History:  Procedure Laterality Date   PARTIAL HYSTERECTOMY  2002   Social History:  reports that she has never  smoked. She has never used smokeless tobacco. She reports that she does not drink alcohol and does not use drugs.  No Known Allergies  Family History  Problem Relation Age of Onset   Stroke Mother    Hypertension Mother    Healthy Father     Prior to Admission medications   Medication Sig Start Date End Date Taking? Authorizing Provider  aspirin EC 81 MG tablet Take 81 mg by mouth.    [provider]  carvedilol (COREG) 6.25 MG tablet Take 6.25 mg by mouth. 02/24/17   [provider]  clotrimazole  (LOTRIMIN ) 1 % external solution Apply 1 application topically 2 (two) times daily. In between toes 06/06/20   Stover, Titorya, DPM  clotrimazole -betamethasone  (LOTRISONE ) cream Apply 1 application topically 2 (two) times daily. 06/06/20   Stover, Titorya, DPM  diclofenac  Sodium (VOLTAREN ) 1 % GEL Apply 4 g topically 4 (four) times daily. 07/18/19   Stover, Titorya, DPM  hydrochlorothiazide (MICROZIDE) 12.5 MG capsule Take by mouth.    [provider]  hydrocortisone  2.5 % cream Apply topically. 05/17/20   [provider]  hydrocortisone  2.5 % cream APPLY TOPICALLY TWICE A DAY 10/04/20   Stover, Titorya, DPM  hydrOXYzine (ATARAX/VISTARIL) 25 MG tablet Take by mouth. 02/27/16   [provider]  ibuprofen  (ADVIL ) 800 MG tablet TAKE 1 TABLET (800 MG TOTAL) BY MOUTH IN THE MORNING AND AT BEDTIME. 01/08/21   Stover, Titorya, DPM  meloxicam (MOBIC) 7.5 MG tablet meloxicam 7.5 mg tablet  TAKE 1 TABLET (7.5 MG TOTAL) BY MOUTH DAILY. TAKE WITH FOOD    [provider]  nitrofurantoin, macrocrystal-monohydrate, (MACROBID) 100 MG capsule  nitrofurantoin monohydrate/macrocrystals 100 mg capsule  TAKE 1 CAPSULE BY MOUTH EVERY 12 HOURS FOR 7 DAYS    [provider]  ramipril (ALTACE) 5 MG capsule ramipril 5 mg capsule  TAKE 1 CAPSULE BY MOUTH EVERY DAY 10/06/16   [provider]  rosuvastatin  (CRESTOR ) 20 MG tablet One tab po on Monday, Wednesday,  Friday 04/24/20   [name], MD  sulfamethoxazole-trimethoprim (BACTRIM DS) 800-160 MG tablet sulfamethoxazole 800 mg-trimethoprim 160 mg tablet  TAKE 1 TABLET BY MOUTH TWICE A DAY 3FD    [provider]  Vitamin D , Ergocalciferol , (DRISDOL) 1.25 MG (50000 UNIT) CAPS capsule TAKE 1 CAPSULE BY MOUTH ONE TIME PER WEEK 02/24/21   [name], MD    Physical Exam: Vitals:   04/09/24 1346 04/09/24 1503 04/09/24 1507 04/09/24 1520  BP: 109/62     Pulse: 92  99 (!) 105  Resp: (!) 25     Temp:  97.8 F (36.6 C)    TempSrc:  Oral    SpO2: 95%  95% 96%  Weight:    90.5 kg  Height:    5' 2 (1.575 m)     Assessment and Plan: Severe sepsis likely acute diverticulitis with localized perforation Concern for small abscess with distal free air S/p NG tube On admission, leukocytosis, lactic acidosis, tachycardia Currently afebrile, with leukocytosis BC x 2 pending Lactic acidosis 2.1->2.6, will trend CT abdomen/pelvis showed possible left lower quadrant bowel wall thickening with some small amount of pneumoperitoneum with findings concerning for perforated viscus likely 2/2 small bowel enteritis General Surgery on board, plan for nonoperative management pending overall clinical status Continue n.p.o. status, NG tube, gentle IV fluid (given HFrEF), plan to increase fluid as needed Continue IV Zosyn Monitor closely, telemetry  HFrEF Primary idiopathic dilated cardiomyopathy Appears compensated No edema noted, currently saturating well on room air, denies any SOB, chest pain BNP pending Chest x-ray pending Echo done at outside hospital showed EF of 30 to 35% on 12/2023 Gentle hydration as above Hold home ASA, Lasix, Entresto, coreg, hydrochlorothiazide, Aldactone (pending med rec) as well as clinical status Telemetry  Hypertension BP stable to soft Continue IV fluids Hold meds as above  Hyperlipidemia LDL 187 on 10/2023 ??Not able to tolerate statins  Prediabetes A1c  5.7 Outpatient monitor  Incidental left ovarian mass CT abdomen/pelvis showed 28 mm LEFT ovarian low-density mass measuring slightly above fluid density. Recommend further evaluation with nonemergent outpatient pelvic ultrasound  Obesity class II BMI 36.51 Lifestyle modification advised   Advance Care Planning:   Code Status: Full Code   Consults: General surgery  Family Communication: Discussed with daughter at bedside  Severity of Illness: The appropriate patient status for this patient is INPATIENT. Inpatient status is judged to be reasonable and necessary in order to provide the required intensity of service to ensure the patient's safety. The patient's presenting symptoms, physical exam findings, and initial radiographic and laboratory data in the context of their chronic comorbidities is felt to place them at high risk for further clinical deterioration. Furthermore, it is not anticipated that the patient will be medically stable for discharge from the hospital within 2 midnights of admission.   * I certify that at the point of admission it is my clinical judgment that the patient will require inpatient hospital care spanning beyond 2 midnights from the point of admission due to high intensity of service, high risk for further deterioration and high frequency of surveillance required.*  Author: Lebron JINNY Cage, MD 04/09/2024  4:30 PM  For on call review [email or web address].

## 2024-04-10 DIAGNOSIS — R198 Other specified symptoms and signs involving the digestive system and abdomen: Secondary | ICD-10-CM | POA: Diagnosis not present

## 2024-04-10 LAB — COMPREHENSIVE METABOLIC PANEL WITH GFR
ALT: 21 U/L (ref 0–44)
AST: 14 U/L — ABNORMAL LOW (ref 15–41)
Albumin: 3 g/dL — ABNORMAL LOW (ref 3.5–5.0)
Alkaline Phosphatase: 66 U/L (ref 38–126)
Anion gap: 10 (ref 5–15)
BUN: 26 mg/dL — ABNORMAL HIGH (ref 8–23)
CO2: 25 mmol/L (ref 22–32)
Calcium: 9.5 mg/dL (ref 8.9–10.3)
Chloride: 104 mmol/L (ref 98–111)
Creatinine, Ser: 1.13 mg/dL — ABNORMAL HIGH (ref 0.44–1.00)
GFR, Estimated: 52 mL/min — ABNORMAL LOW (ref >60)
Glucose, Bld: 136 mg/dL — ABNORMAL HIGH (ref 70–99)
Potassium: 4.2 mmol/L (ref 3.5–5.1)
Sodium: 139 mmol/L (ref 135–145)
Total Bilirubin: 1 mg/dL (ref 0.0–1.2)
Total Protein: 6.5 g/dL (ref 6.5–8.1)

## 2024-04-10 LAB — CBC
HCT: 40.3 % (ref 36.0–46.0)
Hemoglobin: 13 g/dL (ref 12.0–15.0)
MCH: 28.3 pg (ref 26.0–34.0)
MCHC: 32.3 g/dL (ref 30.0–36.0)
MCV: 87.6 fL (ref 80.0–100.0)
Platelets: 270 K/uL (ref 150–400)
RBC: 4.6 MIL/uL (ref 3.87–5.11)
RDW: 14.5 % (ref 11.5–15.5)
WBC: 22.3 K/uL — ABNORMAL HIGH (ref 4.0–10.5)
nRBC: 0 % (ref 0.0–0.2)

## 2024-04-10 LAB — LIPID PANEL
Cholesterol: 198 mg/dL (ref 0–200)
HDL: 82 mg/dL (ref >40)
LDL Cholesterol: 106 mg/dL — ABNORMAL HIGH (ref 0–99)
Total CHOL/HDL Ratio: 2.4 ratio
Triglycerides: 50 mg/dL (ref <150)
VLDL: 10 mg/dL (ref 0–40)

## 2024-04-10 MED ORDER — SENNOSIDES-DOCUSATE SODIUM 8.6-50 MG PO TABS: 1.0000 | ORAL_TABLET | Freq: Two times a day (BID) | ORAL | Status: DC | PRN

## 2024-04-10 MED ORDER — SODIUM CHLORIDE 0.9 % IV SOLN: INTRAVENOUS | Status: AC

## 2024-04-10 NOTE — Progress Notes (Signed)
 PROGRESS NOTE  Summer Watkins FMW:[id number] DOB: [birth year] DOA: 04/09/2024 PCP: Evangelina Tinnie Norris, PA-C  HPI/Recap of past 24 hours: Summer Watkins is a 73 y.o. female with medical history significant of HFrEF, hypertension, hyperlipidemia, prediabetes, presents to [facility] ED complaining of significant left lower quadrant abdominal pain, nausea/vomiting, diarrhea for 1 day, which started on 04/08/2024.In [facility] ED, initial vital signs otherwise stable. Labs showed WBC 17.6, noted lactic acidosis 2.1-2.6, otherwise unremarkable. CT abdomen/pelvis showed possible left lower quadrant bowel wall thickening with some small amount of pneumoperitoneum with findings concerning for perforated viscus likely 2/2 small bowel enteritis. Patient was made n.p.o. and an NG tube was placed.  General surgery consulted.  [facility] hospitalist consulted for admission and further management.   Today, patient still complaining of left lower quadrant abdominal pain, denies any nausea/vomiting.  Appears dry.  NG tube removed today    Assessment/Plan: Principal Problem:   Perforated viscus   Severe sepsis likely 2/2 acute diverticulitis with localized perforation Concern for small abscess with distal free air Small bowel enteritis S/p NG tube, removed on 11/10 On admission, leukocytosis, lactic acidosis, tachycardia Currently afebrile, with leukocytosis BC x 2 pending Lactic acidosis 2.1->2.6-->1.7-->1.2 Procalcitonin 3.69 CT abdomen/pelvis showed possible left lower quadrant bowel wall thickening with some small amount of pneumoperitoneum with findings concerning for perforated viscus likely 2/2 small bowel enteritis General Surgery on board, plan for nonoperative management pending overall clinical status Continue n.p.o. status, gentle IV fluid (given HFrEF), plan to increase fluid as needed Continue IV Zosyn Monitor closely, telemetry   HFrEF Primary idiopathic  dilated cardiomyopathy Appears compensated No edema noted, currently saturating well on room air, denies any SOB, chest pain BNP 283 Chest x-ray unremarkable Echo done at outside hospital showed EF of 30 to 35% on 12/2023 Gentle hydration as above Hold home ASA, Lasix, Entresto, coreg, hydrochlorothiazide, Aldactone (pending med rec) as well as clinical status Telemetry   Hypertension BP stable to soft Continue IV fluids Hold meds as above   Hyperlipidemia LDL 106 ??Not able to tolerate statins   Prediabetes A1c 5.7 Outpatient monitor   Incidental left ovarian mass CT abdomen/pelvis showed 28 mm LEFT ovarian low-density mass measuring slightly above fluid density. 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   Weight as of this encounter: 90.5 kg.     Code Status: Full  Family Communication: Daughter at bedside  Disposition Plan: Status is: Inpatient Remains inpatient appropriate because: Level of care      Consultants: General surgery  Procedures: None  Antimicrobials: IV Zosyn  DVT prophylaxis: SCD   Objective: Vitals:   04/09/24 1719 04/10/24 0028 04/10/24 0439 04/10/24 0806  BP:  (!) 103/58 (!) 141/76 121/61  Pulse: 91 76 87   Resp:  18 18   Temp:  98.3 F (36.8 C) 98 F (36.7 C) 97.6 F (36.4 C)  TempSrc:  Oral Oral Oral  SpO2: 95% 99% 99% 98%  Weight:      Height:        Intake/Output Summary (Last 24 hours) at 04/10/2024 1335 Last data filed at 04/10/2024 0600 Gross per 24 hour  Intake 795.49 ml  Output 900 ml  Net -104.51 ml   Filed Weights   04/09/24 0918 04/09/24 1520  Weight: 83.9 kg 90.5 kg    Exam: General: NAD  Cardiovascular: S1, S2 present Respiratory: CTAB Abdomen: Soft, tender, nondistended,  bowel sounds present Musculoskeletal: No bilateral pedal edema noted Skin: Normal Psychiatry: Normal mood     Data Reviewed: CBC: Recent Labs  Lab 04/09/24 0938 04/10/24 0459  WBC 17.6* 22.3*  HGB 12.8 13.0  HCT 38.9 40.3  MCV 86.3 87.6  PLT 309 270   Basic Metabolic Panel: Recent Labs  Lab 04/09/24 0938 04/10/24 0459  NA 139 139  K 4.5 4.2  CL 103 104  CO2 23 25  GLUCOSE 177* 136*  BUN 25* 26*  CREATININE 0.91 1.13*  CALCIUM  10.3 9.5   GFR: Estimated Creatinine Clearance: 47.1 mL/min (A) (by C-G formula based on SCr of 1.13 mg/dL (H)). Liver Function Tests: Recent Labs  Lab 04/09/24 0938 04/10/24 0459  AST 18 14*  ALT 24 21  ALKPHOS 65 66  BILITOT 0.9 1.0  PROT 6.5 6.5  ALBUMIN 3.9 3.0*   Recent Labs  Lab 04/09/24 0938  LIPASE <10*   No results for input(s): AMMONIA in the last 168 hours. Coagulation Profile: No results for input(s): INR, PROTIME in the last 168 hours. Cardiac Enzymes: No results for input(s): CKTOTAL, CKMB, CKMBINDEX, TROPONINI in the last 168 hours. BNP (last 3 results) No results for input(s): PROBNP in the last 8760 hours. HbA1C: Recent Labs    04/09/24 1653  HGBA1C 5.7*   CBG: No results for input(s): GLUCAP in the last 168 hours. Lipid Profile: Recent Labs    04/10/24 0459  CHOL 198  HDL 82  LDLCALC 106*  TRIG 50  CHOLHDL 2.4   Thyroid Function Tests: No results for input(s): TSH, T4TOTAL, FREET4, T3FREE, THYROIDAB in the last 72 hours. Anemia Panel: No results for input(s): VITAMINB12, FOLATE, FERRITIN, TIBC, IRON, RETICCTPCT in the last 72 hours. Urine analysis:    Component Value Date/Time   COLORURINE YELLOW 04/09/2024 2000   APPEARANCEUR HAZY (A) 04/09/2024 2000   LABSPEC >1.046 (H) 04/09/2024 2000   PHURINE 5.0 04/09/2024 2000   GLUCOSEU NEGATIVE 04/09/2024 2000   HGBUR NEGATIVE 04/09/2024 2000   BILIRUBINUR NEGATIVE 04/09/2024 2000   BILIRUBINUR Negative  04/24/2020 0938   KETONESUR NEGATIVE 04/09/2024 2000   PROTEINUR NEGATIVE 04/09/2024 2000   UROBILINOGEN 1.0 04/24/2020 0938   NITRITE NEGATIVE 04/09/2024 2000   LEUKOCYTESUR NEGATIVE 04/09/2024 2000   Sepsis Labs: @LABRCNTIP (procalcitonin:4,lacticidven:4)  ) Recent Results (from the past 240 hours)  Culture, blood (Routine X 2) w Reflex to ID Panel     Status: None (Preliminary result)   Collection Time: 04/09/24  4:53 PM   Specimen: BLOOD  Result Value Ref Range Status   Specimen Description BLOOD SITE NOT SPECIFIED  Final   Special Requests   Final    BOTTLES DRAWN AEROBIC AND ANAEROBIC Blood Culture results may not be optimal due to an inadequate volume of blood received in culture bottles   Culture   Final    NO GROWTH < 24 HOURS Performed at [facility], [address] [place], [place] [address]    Report Status PENDING  Incomplete  Culture, blood (Routine X 2) w Reflex to ID Panel     Status: None (Preliminary result)   Collection Time: 04/09/24  4:54 PM   Specimen: BLOOD  Result Value Ref Range Status   Specimen Description BLOOD SITE NOT SPECIFIED  Final   Special Requests   Final    BOTTLES DRAWN AEROBIC ONLY Blood  Culture results may not be optimal due to an inadequate volume of blood received in culture bottles   Culture   Final    NO GROWTH < 24 HOURS Performed at [facility], [address], [place], [place] [address]    Report Status PENDING  Incomplete      Studies: DG CHEST PORT 1 VIEW Result Date: 04/09/2024 EXAM: 1 VIEW(S) XRAY OF THE CHEST 04/09/2024 05:42:00 PM COMPARISON: 09/24/2022. CLINICAL HISTORY: Sepsis (HCC). FINDINGS: LINES, TUBES AND DEVICES: Enteric tube courses into the proximal stomach. LUNGS AND PLEURA: Mild right basilar atelectasis. No pulmonary edema. No pleural effusion. No pneumothorax. HEART AND MEDIASTINUM: No acute abnormality of the cardiac and mediastinal silhouettes. BONES AND SOFT TISSUES: No acute osseous  abnormality. IMPRESSION: 1. No acute findings. Electronically signed by: Pinkie Pebbles MD 04/09/2024 07:44 PM EST RP Workstation: [id number]    Scheduled Meds:  Continuous Infusions:  sodium chloride 75 mL/hr at 04/10/24 1106   piperacillin-tazobactam 3.375 g (04/10/24 1056)     LOS: 1 day     Markise Haymer J Lilburn Straw, MD [facility]  If 7PM-7AM, please contact night-coverage [email or web address] 04/10/2024, 1:35 PM

## 2024-04-10 NOTE — Plan of Care (Signed)
   Problem: Education: Goal: Knowledge of General Education information will improve Description Including pain rating scale, medication(s)/side effects and non-pharmacologic comfort measures Outcome: Progressing   Problem: Health Behavior/Discharge Planning: Goal: Ability to manage health-related needs will improve Outcome: Progressing

## 2024-04-10 NOTE — Progress Notes (Signed)
 Subjective: CC: She reports her LLQ is significantly improved since admission. Last received PRN pain medication at 0601.  No nausea. NGT w/ 600cc/24 hours.  No flatus or BM.   No hx of diverticulitis. Reports Colonoscopy in 2020 with Dr. Kristie that was normal.  She reports she did take frequent NSAIDs prior to admission.  She denies personal or family hx of IBD. History of pancreatitic CA in her sister. No other family hx of GI cancers.   Objective: Vital signs in last 24 hours: Temp:  [97.6 F (36.4 C)-98.3 F (36.8 C)] 97.6 F (36.4 C) (11/10 0806) Pulse Rate:  [76-105] 87 (11/10 0439) Resp:  [15-25] 18 (11/10 0439) BP: (103-170)/(55-88) 121/61 (11/10 0806) SpO2:  [90 %-100 %] 98 % (11/10 0806) Weight:  [90.5 kg] 90.5 kg (11/09 1520) Last BM Date : 04/09/24  Intake/Output from previous day: 11/09 0701 - 11/10 0700 In: 1874.4 [I.V.:745.5; IV Piggyback:1128.9] Out: 900 [Urine:300; Emesis/NG output:600] Intake/Output this shift: No intake/output data recorded.  PE: Gen:  Alert, NAD, pleasant Abd: Soft, ND, mild suprapubic and llq ttp without rigidity or guarding. NGT in place.   Lab Results:  Recent Labs    04/09/24 0938 04/10/24 0459  WBC 17.6* 22.3*  HGB 12.8 13.0  HCT 38.9 40.3  PLT 309 270   BMET Recent Labs    04/09/24 0938 04/10/24 0459  NA 139 139  K 4.5 4.2  CL 103 104  CO2 23 25  GLUCOSE 177* 136*  BUN 25* 26*  CREATININE 0.91 1.13*  CALCIUM  10.3 9.5   PT/INR No results for input(s): LABPROT, INR in the last 72 hours. CMP     Component Value Date/Time   NA 139 04/10/2024 0459   NA 143 04/24/2020 0945   K 4.2 04/10/2024 0459   CL 104 04/10/2024 0459   CO2 25 04/10/2024 0459   GLUCOSE 136 (H) 04/10/2024 0459   BUN 26 (H) 04/10/2024 0459   BUN 15 04/24/2020 0945   CREATININE 1.13 (H) 04/10/2024 0459   CALCIUM  9.5 04/10/2024 0459   PROT 6.5 04/10/2024 0459   PROT 6.7 04/24/2020 0945   ALBUMIN 3.0 (L) 04/10/2024 0459    ALBUMIN 4.1 04/24/2020 0945   AST 14 (L) 04/10/2024 0459   ALT 21 04/10/2024 0459   ALKPHOS 66 04/10/2024 0459   BILITOT 1.0 04/10/2024 0459   BILITOT 0.5 04/24/2020 0945   GFRNONAA 52 (L) 04/10/2024 0459   GFRAA 95 04/24/2020 0945   Lipase     Component Value Date/Time   LIPASE <10 (L) 04/09/2024 0938    Studies/Results: DG CHEST PORT 1 VIEW Result Date: 04/09/2024 EXAM: 1 VIEW(S) XRAY OF THE CHEST 04/09/2024 05:42:00 PM COMPARISON: 09/24/2022. CLINICAL HISTORY: Sepsis (HCC). FINDINGS: LINES, TUBES AND DEVICES: Enteric tube courses into the proximal stomach. LUNGS AND PLEURA: Mild right basilar atelectasis. No pulmonary edema. No pleural effusion. No pneumothorax. HEART AND MEDIASTINUM: No acute abnormality of the cardiac and mediastinal silhouettes. BONES AND SOFT TISSUES: No acute osseous abnormality. IMPRESSION: 1. No acute findings. Electronically signed by: Pinkie Pebbles MD 04/09/2024 07:44 PM EST RP Workstation: [id number]   DG Abd Portable 1V Result Date: 04/09/2024 EXAM: 1 VIEW XRAY OF THE ABDOMEN 04/09/2024 12:23:19 PM COMPARISON: None available. CLINICAL HISTORY: Encounter for nasogastric (NG) tube placement FINDINGS: LINES, TUBES AND DEVICES: Nasogastric tube terminates overthe gastric body with side port not well visualized. BOWEL: No gross free intraperitoneal air. BONES: No acute osseous abnormality. LOWER CHEST: Cardiomegaly. IMPRESSION: 1.  Nasogastric terminating over the  gastric body. Electronically signed by: Rockey Kilts MD 04/09/2024 12:55 PM EST RP Workstation: [id number]   CT ABDOMEN PELVIS W CONTRAST Result Date: 04/09/2024 CLINICAL DATA:  Bowel obstruction suspected Diverticulitis, complication suspected EXAM: CT ABDOMEN AND PELVIS WITH CONTRAST TECHNIQUE: Multidetector CT imaging of the abdomen and pelvis was performed using the standard protocol following bolus administration of intravenous contrast. RADIATION DOSE REDUCTION: This exam was performed according to  the departmental dose-optimization program which includes automated exposure control, adjustment of the mA and/or kV according to patient size and/or use of iterative reconstruction technique. CONTRAST:  80mL OMNIPAQUE IOHEXOL 300 MG/ML  SOLN COMPARISON:  None Available. FINDINGS: Lower chest: No acute abnormality. Hepatobiliary: No density of the liver which could reflect underlying hepatic steatosis. More focal fatty deposition along the falciform ligament. Gallbladder is unremarkable. No extrahepatic biliary ductal dilation. Pancreas: Unremarkable. No pancreatic ductal dilatation or surrounding inflammatory changes. Spleen: Normal in size without focal abnormality. Adrenals/Urinary Tract: Kidneys enhance symmetrically. Adrenal glands are unremarkable. No hydronephrosis. No obstructing nephrolithiasis. Bladder is decompressed. Stomach/Bowel: There is a small amount of pneumoperitoneum overlying the liver and centered in the upper abdomen. In the LEFT lower quadrant, there are several adjacent loops of small bowel which demonstrate bowel wall thickening and some intervening areas of fat stranding and trace amount of fluid. Diverticulosis of the sigmoid colon. There is some mild fat stranding along the sigmoid colon without bowel wall thickening; this is in the region of the [facility] loops of small bowel in the LEFT lower quadrant. Moderate predominately RIGHT-sided colonic stool burden. Small hiatal hernia. Appendix is normal. Vascular/Lymphatic: Atherosclerotic calcifications. No suspicious adenopathy noted. Reproductive: Status post hysterectomy. 28 mm LEFT ovarian low-density mass measuring slightly above fluid density. Other: No focal drainable fluid collection. Musculoskeletal: No acute or significant osseous findings. IMPRESSION: 1. There is a small amount of pneumoperitoneum centered in the upper abdomen. There are several adherent loops of small bowel in the LEFT lower quadrant which demonstrate bowel wall  thickening and some intervening areas of fat stranding and trace amount of fluid. Findings are concerning for perforated viscus, likely secondary to a nonspecific infectious or inflammatory small bowel enteritis versus sequela of ingestion of foreign body. 2. There is some mild fat stranding along the sigmoid colon which is favored to be reactive to adjacent enteritis. No definitive acute diverticulitis. 3. 28 mm LEFT ovarian low-density mass measuring slightly above fluid density. Recommend further evaluation with nonemergent outpatient pelvic ultrasound. Aortic Atherosclerosis (ICD10-I70.0). These results were called by telephone at the time of interpretation on 04/09/2024 at 11:03 am to Dr. CARON SALT , who verbally acknowledged these results. Electronically Signed   By: Corean Salter M.D.   On: 04/09/2024 11:06    Anti-infectives: Anti-infectives (From admission, onward)    Start     Dose/Rate Route Frequency Ordered Stop   04/09/24 1800  piperacillin-tazobactam (ZOSYN) IVPB 3.375 g  Status:  Discontinued       Placed in Followed by Linked Group   3.375 g 12.5 mL/hr over 240 Minutes Intravenous Every 8 hours 04/09/24 1109 04/09/24 1123   04/09/24 1800  piperacillin-tazobactam (ZOSYN) IVPB 3.375 g       Placed in Followed by Linked Group   3.375 g 12.5 mL/hr over 240 Minutes Intravenous Every 8 hours 04/09/24 1201     04/09/24 1215  piperacillin-tazobactam (ZOSYN) IVPB 3.375 g  Status:  Discontinued        3.375 g 100 mL/hr over 30 Minutes  Intravenous  Once 04/09/24 1200 04/09/24 1201   04/09/24 1215  piperacillin-tazobactam (ZOSYN) IVPB 3.375 g       Placed in Followed by Linked Group   3.375 g 100 mL/hr over 30 Minutes Intravenous  Once 04/09/24 1201 04/09/24 1302   04/09/24 1115  piperacillin-tazobactam (ZOSYN) IVPB 3.375 g  Status:  Discontinued       Placed in Followed by Linked Group   3.375 g 100 mL/hr over 30 Minutes Intravenous  Once 04/09/24 1109 04/09/24 1123         Assessment/Plan Perforated Diverticulitis - Agree with consult note that I suspect this is diverticulitis of the sigmoid colon with localized perforation with concern for small abscess and distal free air. Suspect small bowel changes on CT are reactive.  - Afebrile. Tachycardia resolved. No systolic hypotension. Lactic acid cleared. WBC up at 22.3 from 17.6 but suspect this may be at least in part to hemoconcentration given hgb, Cr, BUN etc are up as well.  - Abdomen soft without evidence of peritonitis - No indication for emergency surgery - D/c NGT. Continue NPO - Cont abx - Nothing appears drainable by IR currently.  - Hopefully patient will improve with conservative treatment.  If patient fails to improve they may require repeating imaging (CT w/ PO and IV contrast to better delineate), drain placement, or surgical intervention that could result in a bowel resection and ostomy.  This was discussed with the patient. - If patient improves with conservative therapies would recommend colonoscopy in ~6-8 weeks.   - We will follow with you - Updated her daughter, Jon, at bedside. Message sent to TRH.   FEN - D/c NGT, NPO, message sent to TRH regarding IVF.  VTE - SCDs, Lovenox ID - Zosyn  - Per TRH -  HFrEF Primary idiopathic dilated cardiomyopathy Hypertension  Hyperlipidemia  Prediabetes  Incidental findings - Left ovarian mass. Aortic Atherosclerosis   I reviewed nursing notes, hospitalist notes, last 24 h vitals and pain scores, last 48 h intake and output, last 24 h labs and trends, and last 24 h imaging results.     LOS: 1 day    Ozell CHRISTELLA Shaper, [facility] Surgery 04/10/2024, 9:58 AM Please see Amion for pager number during day hours 7:00am-4:30pm

## 2024-04-11 DIAGNOSIS — R198 Other specified symptoms and signs involving the digestive system and abdomen: Secondary | ICD-10-CM | POA: Diagnosis not present

## 2024-04-11 LAB — COMPREHENSIVE METABOLIC PANEL WITH GFR
ALT: 18 U/L (ref 0–44)
AST: 14 U/L — ABNORMAL LOW (ref 15–41)
Albumin: 2.4 g/dL — ABNORMAL LOW (ref 3.5–5.0)
Alkaline Phosphatase: 60 U/L (ref 38–126)
Anion gap: 14 (ref 5–15)
BUN: 30 mg/dL — ABNORMAL HIGH (ref 8–23)
CO2: 24 mmol/L (ref 22–32)
Calcium: 9 mg/dL (ref 8.9–10.3)
Chloride: 104 mmol/L (ref 98–111)
Creatinine, Ser: 1.21 mg/dL — ABNORMAL HIGH (ref 0.44–1.00)
GFR, Estimated: 48 mL/min — ABNORMAL LOW (ref >60)
Glucose, Bld: 105 mg/dL — ABNORMAL HIGH (ref 70–99)
Potassium: 3.9 mmol/L (ref 3.5–5.1)
Sodium: 142 mmol/L (ref 135–145)
Total Bilirubin: 1.2 mg/dL (ref 0.0–1.2)
Total Protein: 5.8 g/dL — ABNORMAL LOW (ref 6.5–8.1)

## 2024-04-11 LAB — CBC WITH DIFFERENTIAL/PLATELET
Abs Immature Granulocytes: 0.17 K/uL — ABNORMAL HIGH (ref 0.00–0.07)
Basophils Absolute: 0 K/uL (ref 0.0–0.1)
Basophils Relative: 0 %
Eosinophils Absolute: 0 K/uL (ref 0.0–0.5)
Eosinophils Relative: 0 %
HCT: 32.5 % — ABNORMAL LOW (ref 36.0–46.0)
Hemoglobin: 10.4 g/dL — ABNORMAL LOW (ref 12.0–15.0)
Immature Granulocytes: 1 %
Lymphocytes Relative: 4 %
Lymphs Abs: 0.7 K/uL (ref 0.7–4.0)
MCH: 28.2 pg (ref 26.0–34.0)
MCHC: 32 g/dL (ref 30.0–36.0)
MCV: 88.1 fL (ref 80.0–100.0)
Monocytes Absolute: 1.2 K/uL — ABNORMAL HIGH (ref 0.1–1.0)
Monocytes Relative: 6 %
Neutro Abs: 17.6 K/uL — ABNORMAL HIGH (ref 1.7–7.7)
Neutrophils Relative %: 89 %
Platelets: 255 K/uL (ref 150–400)
RBC: 3.69 MIL/uL — ABNORMAL LOW (ref 3.87–5.11)
RDW: 14.6 % (ref 11.5–15.5)
WBC: 19.7 K/uL — ABNORMAL HIGH (ref 4.0–10.5)
nRBC: 0 % (ref 0.0–0.2)

## 2024-04-11 MED ORDER — SODIUM CHLORIDE 0.9 % IV SOLN: INTRAVENOUS | Status: AC

## 2024-04-11 NOTE — TOC Initial Note (Signed)
 Transition of Care [facility]) - Initial/Assessment Note    Patient Details  Name: Summer Watkins MRN: [id number] Date of Birth: [birth year]  Transition of [facility] CM/SW Contact:    Sudie Erminio Deems, RN Phone Number: 04/11/2024, 2:19 PM  Clinical Narrative: Patient presented for abdominal pain. PTA patient was independent from home alone. Children at the bedside during the visit. Patient has PCP and she has no issues with transportation. ICM will continue to follow for additional needs as she progresses.                   Expected Discharge Plan: Home/Self Care Barriers to Discharge: No Barriers Identified  Patient Goals and CMS Choice Patient states their goals for this hospitalization and ongoing recovery are:: Plan to return home once stable.   Expected Discharge Plan and Services In-house Referral: NA Discharge Planning Services: CM Consult Post Acute Care Choice: NA Living arrangements for the past 2 months: Single Family Home                   DME Agency: NA   Prior Living Arrangements/Services Living arrangements for the past 2 months: Single Family Home Lives with:: Self Patient language and need for interpreter reviewed:: Yes        Need for Family Participation in Patient Care: No (Comment) Care giver support system in place?: No (comment)   Criminal Activity/Legal Involvement Pertinent to Current Situation/Hospitalization: No - Comment as needed  Activities of Daily Living   ADL Screening (condition at time of admission) Independently performs ADLs?: No Does the patient have a NEW difficulty with bathing/dressing/toileting/self-feeding that is expected to last >3 days?: Yes (Initiates electronic notice to provider for possible OT consult) Does the patient have a NEW difficulty with getting in/out of bed, walking, or climbing stairs that is expected to last >3 days?: Yes (Initiates electronic notice to provider for possible PT consult) Does the patient  have a NEW difficulty with communication that is expected to last >3 days?: No Is the patient deaf or have difficulty hearing?: No Does the patient have difficulty seeing, even when wearing glasses/contacts?: No Does the patient have difficulty concentrating, remembering, or making decisions?: No  Permission Sought/Granted Permission sought to share information with : Case Manager, Family Supports   Emotional Assessment Appearance:: Appears stated age Attitude/Demeanor/Rapport: Engaged Affect (typically observed): Appropriate Orientation: : Oriented to Self, Oriented to Place, Oriented to  Time Alcohol / Substance Use: Not Applicable Psych Involvement: No (comment)  Admission diagnosis:  Perforated viscus [R19.8] Free intraperitoneal air [K66.8] Patient Active Problem List   Diagnosis Date Noted   Perforated viscus 04/09/2024   Colon cancer screening 06/06/2020   Constipation 06/06/2020   Primary osteoarthritis of first carpometacarpal joint of right hand 11/29/2019   Arthritis of right wrist 11/08/2019   Ulnocarpal abutment syndrome, 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/2019   Hypertensive heart disease with chronic systolic congestive heart failure (HCC) 10/21/2019   Right wrist pain 10/21/2019   Skin tag 10/21/2019   Class 1 obesity due to excess calories with serious comorbidity and body mass index (BMI) of 34.0 to 34.9 in adult 10/21/2019   Hyperlipidemia 06/12/2019   Heart disease 06/12/2019   Pure hypercholesterolemia 06/12/2019   Idiopathic cardiomyopathy (HCC) 04/13/2017   Benign essential hypertension 09/23/2015   Chronic systolic (congestive) heart failure (HCC) 09/23/2015   Primary idiopathic dilated cardiomyopathy (HCC) 09/23/2015   Porokeratosis 11/16/2012   Pain in joint, ankle and foot 11/16/2012   PCP:  Evangelina Tinnie Norris,  PA-C Pharmacy:   [id number] [place], [place] - [phone] [place] [address] Phone: [phone] Fax:  [phone]     Social Drivers of Health (SDOH) Social History: SDOH Screenings   Food Insecurity: No Food Insecurity (04/09/2024)  Housing: Low Risk  (04/09/2024)  Transportation Needs: No Transportation Needs (04/09/2024)  Utilities: Not At Risk (04/09/2024)  Depression (PHQ2-9): Low Risk  (10/19/2019)  Social Connections: Unknown (04/09/2024)  Tobacco Use: Low Risk  (04/09/2024)   SDOH Interventions:     Readmission Risk Interventions     No data to display

## 2024-04-11 NOTE — Progress Notes (Signed)
 Subjective: CC: She reports stable lower abdominal pain from yesterday. Pain is most in her LLQ and improved from admission. She reports n/v after taking a PO pain medication yesterday. No n/v before or after this otherwise. No flatus or BM.   Objective: Vital signs in last 24 hours: Temp:  [97.9 F (36.6 C)-99.1 F (37.3 C)] 99.1 F (37.3 C) (11/11 0803) Pulse Rate:  [76-98] 76 (11/11 0803) Resp:  [17-18] 17 (11/11 0803) BP: (120-141)/(52-72) 126/67 (11/11 0803) SpO2:  [90 %-94 %] 94 % (11/11 0803) Last BM Date : 04/09/24  Intake/Output from previous day: 11/10 0701 - 11/11 0700 In: 589 [I.V.:538; IV Piggyback:51] Out: -  Intake/Output this shift: No intake/output data recorded.  PE: Gen:  Alert, NAD, pleasant Abd: Soft, ND, stable mild suprapubic and llq ttp without rigidity or guarding.  Lab Results:  Recent Labs    04/10/24 0459 04/11/24 0433  WBC 22.3* 19.7*  HGB 13.0 10.4*  HCT 40.3 32.5*  PLT 270 255   BMET Recent Labs    04/10/24 0459 04/11/24 0433  NA 139 142  K 4.2 3.9  CL 104 104  CO2 25 24  GLUCOSE 136* 105*  BUN 26* 30*  CREATININE 1.13* 1.21*  CALCIUM  9.5 9.0   PT/INR No results for input(s): LABPROT, INR in the last 72 hours. CMP     Component Value Date/Time   NA 142 04/11/2024 0433   NA 143 04/24/2020 0945   K 3.9 04/11/2024 0433   CL 104 04/11/2024 0433   CO2 24 04/11/2024 0433   GLUCOSE 105 (H) 04/11/2024 0433   BUN 30 (H) 04/11/2024 0433   BUN 15 04/24/2020 0945   CREATININE 1.21 (H) 04/11/2024 0433   CALCIUM  9.0 04/11/2024 0433   PROT 5.8 (L) 04/11/2024 0433   PROT 6.7 04/24/2020 0945   ALBUMIN 2.4 (L) 04/11/2024 0433   ALBUMIN 4.1 04/24/2020 0945   AST 14 (L) 04/11/2024 0433   ALT 18 04/11/2024 0433   ALKPHOS 60 04/11/2024 0433   BILITOT 1.2 04/11/2024 0433   BILITOT 0.5 04/24/2020 0945   GFRNONAA 48 (L) 04/11/2024 0433   GFRAA 95 04/24/2020 0945   Lipase     Component Value Date/Time   LIPASE <10 (L)  04/09/2024 0938    Studies/Results: DG CHEST PORT 1 VIEW Result Date: 04/09/2024 EXAM: 1 VIEW(S) XRAY OF THE CHEST 04/09/2024 05:42:00 PM COMPARISON: 09/24/2022. CLINICAL HISTORY: Sepsis (HCC). FINDINGS: LINES, TUBES AND DEVICES: Enteric tube courses into the proximal stomach. LUNGS AND PLEURA: Mild right basilar atelectasis. No pulmonary edema. No pleural effusion. No pneumothorax. HEART AND MEDIASTINUM: No acute abnormality of the cardiac and mediastinal silhouettes. BONES AND SOFT TISSUES: No acute osseous abnormality. IMPRESSION: 1. No acute findings. Electronically signed by: Pinkie Pebbles MD 04/09/2024 07:44 PM EST RP Workstation: [id number]   DG Abd Portable 1V Result Date: 04/09/2024 EXAM: 1 VIEW XRAY OF THE ABDOMEN 04/09/2024 12:23:19 PM COMPARISON: None available. CLINICAL HISTORY: Encounter for nasogastric (NG) tube placement FINDINGS: LINES, TUBES AND DEVICES: Nasogastric tube terminates overthe gastric body with side port not well visualized. BOWEL: No gross free intraperitoneal air. BONES: No acute osseous abnormality. LOWER CHEST: Cardiomegaly. IMPRESSION: 1. Nasogastric terminating over the  gastric body. Electronically signed by: Rockey Kilts MD 04/09/2024 12:55 PM EST RP Workstation: [id number]   CT ABDOMEN PELVIS W CONTRAST Result Date: 04/09/2024 CLINICAL DATA:  Bowel obstruction suspected Diverticulitis, complication suspected EXAM: CT ABDOMEN AND PELVIS WITH CONTRAST TECHNIQUE: Multidetector CT imaging of  the abdomen and pelvis was performed using the standard protocol following bolus administration of intravenous contrast. RADIATION DOSE REDUCTION: This exam was performed according to the departmental dose-optimization program which includes automated exposure control, adjustment of the mA and/or kV according to patient size and/or use of iterative reconstruction technique. CONTRAST:  80mL OMNIPAQUE IOHEXOL 300 MG/ML  SOLN COMPARISON:  None Available. FINDINGS: Lower chest: No  acute abnormality. Hepatobiliary: No density of the liver which could reflect underlying hepatic steatosis. More focal fatty deposition along the falciform ligament. Gallbladder is unremarkable. No extrahepatic biliary ductal dilation. Pancreas: Unremarkable. No pancreatic ductal dilatation or surrounding inflammatory changes. Spleen: Normal in size without focal abnormality. Adrenals/Urinary Tract: Kidneys enhance symmetrically. Adrenal glands are unremarkable. No hydronephrosis. No obstructing nephrolithiasis. Bladder is decompressed. Stomach/Bowel: There is a small amount of pneumoperitoneum overlying the liver and centered in the upper abdomen. In the LEFT lower quadrant, there are several adjacent loops of small bowel which demonstrate bowel wall thickening and some intervening areas of fat stranding and trace amount of fluid. Diverticulosis of the sigmoid colon. There is some mild fat stranding along the sigmoid colon without bowel wall thickening; this is in the region of the [facility] loops of small bowel in the LEFT lower quadrant. Moderate predominately RIGHT-sided colonic stool burden. Small hiatal hernia. Appendix is normal. Vascular/Lymphatic: Atherosclerotic calcifications. No suspicious adenopathy noted. Reproductive: Status post hysterectomy. 28 mm LEFT ovarian low-density mass measuring slightly above fluid density. Other: No focal drainable fluid collection. Musculoskeletal: No acute or significant osseous findings. IMPRESSION: 1. There is a small amount of pneumoperitoneum centered in the upper abdomen. There are several adherent loops of small bowel in the LEFT lower quadrant which demonstrate bowel wall thickening and some intervening areas of fat stranding and trace amount of fluid. Findings are concerning for perforated viscus, likely secondary to a nonspecific infectious or inflammatory small bowel enteritis versus sequela of ingestion of foreign body. 2. There is some mild fat stranding along  the sigmoid colon which is favored to be reactive to adjacent enteritis. No definitive acute diverticulitis. 3. 28 mm LEFT ovarian low-density mass measuring slightly above fluid density. Recommend further evaluation with nonemergent outpatient pelvic ultrasound. Aortic Atherosclerosis (ICD10-I70.0). These results were called by telephone at the time of interpretation on 04/09/2024 at 11:03 am to Dr. CARON SALT , who verbally acknowledged these results. Electronically Signed   By: Corean Salter M.D.   On: 04/09/2024 11:06    Anti-infectives: Anti-infectives (From admission, onward)    Start     Dose/Rate Route Frequency Ordered Stop   04/09/24 1800  piperacillin-tazobactam (ZOSYN) IVPB 3.375 g  Status:  Discontinued       Placed in Followed by Linked Group   3.375 g 12.5 mL/hr over 240 Minutes Intravenous Every 8 hours 04/09/24 1109 04/09/24 1123   04/09/24 1800  piperacillin-tazobactam (ZOSYN) IVPB 3.375 g       Placed in Followed by Linked Group   3.375 g 12.5 mL/hr over 240 Minutes Intravenous Every 8 hours 04/09/24 1201     04/09/24 1215  piperacillin-tazobactam (ZOSYN) IVPB 3.375 g  Status:  Discontinued        3.375 g 100 mL/hr over 30 Minutes Intravenous  Once 04/09/24 1200 04/09/24 1201   04/09/24 1215  piperacillin-tazobactam (ZOSYN) IVPB 3.375 g       Placed in Followed by Linked Group   3.375 g 100 mL/hr over 30 Minutes Intravenous  Once 04/09/24 1201 04/09/24 1302   04/09/24 1115  piperacillin-tazobactam (  ZOSYN) IVPB 3.375 g  Status:  Discontinued       Placed in Followed by Linked Group   3.375 g 100 mL/hr over 30 Minutes Intravenous  Once 04/09/24 1109 04/09/24 1123        Assessment/Plan Perforated Diverticulitis - Agree with consult note that I suspect this is diverticulitis of the sigmoid colon with localized perforation with concern for small abscess and distal free air. Suspect small bowel changes on CT are reactive.  - Afebrile. No tachycardia or  systolic hypotension. Lactic acid cleared. WBC downtrending at 19.7 from 22.3.  - Abdomen soft without evidence of peritonitis - No indication for emergency surgery - Cont abx - Nothing appears drainable by IR currently.  - Allow sips and chips today - Hopefully patient will improve with conservative treatment.  If patient fails to improve they may require repeating imaging (CT w/ PO and IV contrast to better delineate), drain placement, or surgical intervention that could result in a bowel resection and ostomy.  This was discussed with the patient. - If patient improves with conservative therapies would recommend colonoscopy in ~6-8 weeks.   - We will follow with you - Updated her son at bedside. Discussed with TRH in person.   FEN - Sips and chips. IVF per TRH.  VTE - SCDs, okay for chem ppx from a general surgery standpoint ID - Zosyn  - Per TRH -  HFrEF Primary idiopathic dilated cardiomyopathy Hypertension  Hyperlipidemia  Prediabetes  Incidental findings - Left ovarian mass. Aortic Atherosclerosis   I reviewed nursing notes, hospitalist notes, last 24 h vitals and pain scores, last 48 h intake and output, last 24 h labs and trends, and last 24 h imaging results.     LOS: 2 days    Summer Watkins, [facility] 04/11/2024, 9:17 AM Please see Amion for pager number during day hours 7:00am-4:30pm

## 2024-04-11 NOTE — Plan of Care (Signed)
  Problem: Education: Goal: Knowledge of General Education information will improve Description: Including pain rating scale, medication(s)/side effects and non-pharmacologic comfort measures Outcome: Progressing   Problem: Health Behavior/Discharge Planning: Goal: Ability to manage health-related needs will improve Outcome: Progressing   Problem: Clinical Measurements: Goal: Ability to maintain clinical measurements within normal limits will improve Outcome: Progressing Goal: Will remain free from infection Outcome: Progressing Goal: Diagnostic test results will improve Outcome: Progressing Goal: Respiratory complications will improve Outcome: Progressing Goal: Cardiovascular complication will be avoided Outcome: Progressing   Problem: Activity: Goal: Risk for activity intolerance will decrease Outcome: Progressing   Problem: Nutrition: Goal: Adequate nutrition will be maintained Outcome: Progressing   Problem: Coping: Goal: Level of anxiety will decrease Outcome: Progressing   Problem: Elimination: Goal: Will not experience complications related to bowel motility Outcome: Not Progressing Note: Barely having flatulence Goal: Will not experience complications related to urinary retention Outcome: Progressing   Problem: Pain Managment: Goal: General experience of comfort will improve and/or be controlled Outcome: Progressing   Problem: Safety: Goal: Ability to remain free from injury will improve Outcome: Progressing   Problem: Skin Integrity: Goal: Risk for impaired skin integrity will decrease Outcome: Progressing   Problem: Education: Goal: Ability to demonstrate management of disease process will improve Outcome: Progressing Goal: Ability to verbalize understanding of medication therapies will improve Outcome: Progressing Goal: Individualized Educational Video(s) Outcome: Progressing   Problem: Activity: Goal: Capacity to carry out activities will  improve Outcome: Progressing   Problem: Tissue Perfusion: Goal: Hemodynamically stable with effective tissue perfusion will improve Outcome: Progressing Goal: Postoperative complications will be avoided or minimized Outcome: Progressing   Problem: Bowel/Gastric: Goal: Gastrointestinal status for postoperative course will improve Outcome: Progressing Goal: GI tract motility and GI tissue perfusion will improve Outcome: Progressing Goal: Ability to demonstrate the techniques of an individualized bowel program will improve Outcome: Progressing   Problem: Education: Goal: Knowledge of the prescribed therapeutic regimen will improve Outcome: Progressing Goal: Knowledge of injury and care (of blunt abdominal trauma) will improve Outcome: Progressing   Problem: Coping: Goal: Exhibits appropriate coping mechanism and reduced anxiety resulting from physical and emotional stress Outcome: Progressing

## 2024-04-11 NOTE — Progress Notes (Addendum)
 PROGRESS NOTE  Summer Watkins FMW:[id number] DOB: March 29, 1951 DOA: 04/09/2024 PCP: Evangelina Tinnie Norris, PA-C  HPI/Recap of past 24 hours: Summer Watkins is a 73 y.o. female with medical history significant of HFrEF, hypertension, hyperlipidemia, prediabetes, presents to [facility] ED complaining of significant left lower quadrant abdominal pain, nausea/vomiting, diarrhea for 1 day, which started on 04/08/2024.In [facility] ED, initial vital signs otherwise stable. Labs showed WBC 17.6, noted lactic acidosis 2.1-2.6, otherwise unremarkable. CT abdomen/pelvis showed possible left lower quadrant bowel wall thickening with some small amount of pneumoperitoneum with findings concerning for perforated viscus likely 2/2 small bowel enteritis. Patient was made n.p.o. and an NG tube was placed.  General surgery consulted.  [facility] hospital[facility] consulted for admission and further management.    Today, patient had an episode of nausea/vomiting overnight, stated it may be from the p.o. narcotics she received.  No bowel movement/denies passing gas    Assessment/Plan: Principal Problem:   Perforated viscus   Severe sepsis likely 2/2 acute diverticulitis with localized perforation Concern for small abscess with distal free air Small bowel enteritis S/p NG tube, removed on 11/10 On admission, leukocytosis, lactic acidosis, tachycardia Currently afebrile, with leukocytosis BC x 2 pending Lactic acidosis 2.1->2.6-->1.7-->1.2 Procalcitonin 3.69 CT abdomen/pelvis showed possible left lower quadrant bowel wall thickening with some small amount of pneumoperitoneum with findings concerning for perforated viscus likely 2/2 small bowel enteritis General Surgery on board, plan for nonoperative management pending overall clinical status Continue n.p.o. status, cautious IV fluid (given HFrEF) Continue IV Zosyn Monitor closely, telemetry  Possible mild AKI Likely 2/2  above Continue IV fluids Daily CMP   HFrEF Primary idiopathic dilated cardiomyopathy Appears compensated No edema noted, currently saturating well on room air, denies any SOB, chest pain BNP 283 Chest x-ray unremarkable Echo done at outside hospital showed EF of 30 to 35% on 12/2023 Cautious hydration Hold home ASA, Lasix, Entresto, coreg, hydrochlorothiazide, Aldactone (pending med rec) as well as clinical status Telemetry   Hypertension BP stable to soft Continue IV fluids Hold meds as above   Hyperlipidemia LDL 106 ??Not able to tolerate statins   Prediabetes A1c 5.7 Outpatient monitor   Incidental left ovarian mass CT abdomen/pelvis showed 28 mm LEFT ovarian low-density mass measuring slightly above fluid density. 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   Weight as of this encounter: 90.5 kg.     Code Status: Full  Family Communication: son at bedside  Disposition Plan: Status is: Inpatient Remains inpatient appropriate because: Level of care      Consultants: General surgery  Procedures: None  Antimicrobials: IV Zosyn  DVT prophylaxis: SCD   Objective: Vitals:   04/11/24 0459 04/11/24 0803 04/11/24 1155 04/11/24 1535  BP: (!) 122/54 126/67 126/62 127/62  Pulse: 80 76 74 76  Resp: 18 17 18 16   Temp: 98.6 F (37 C) 99.1 F (37.3 C) 98.8 F (37.1 C) 99 F (37.2 C)  TempSrc: Oral Oral Oral Oral  SpO2: 94% 94% 94% 90%  Weight:      Height:       No intake or output data in the 24 hours ending 04/11/24 1821  Filed Weights   04/09/24 0918 04/09/24 1520  Weight: 83.9 kg 90.5 kg    Exam: General:  NAD  Cardiovascular: S1, S2 present Respiratory: CTAB Abdomen: Soft, tender, nondistended, bowel sounds  present Musculoskeletal: No bilateral pedal edema noted Skin: Normal Psychiatry: Normal mood     Data Reviewed: CBC: Recent Labs  Lab 04/09/24 0938 04/10/24 0459 04/11/24 0433  WBC 17.6* 22.3* 19.7*  NEUTROABS  --   --  17.6*  HGB 12.8 13.0 10.4*  HCT 38.9 40.3 32.5*  MCV 86.3 87.6 88.1  PLT 309 270 255   Basic Metabolic Panel: Recent Labs  Lab 04/09/24 0938 04/10/24 0459 04/11/24 0433  NA 139 139 142  K 4.5 4.2 3.9  CL 103 104 104  CO2 23 25 24   GLUCOSE 177* 136* 105*  BUN 25* 26* 30*  CREATININE 0.91 1.13* 1.21*  CALCIUM  10.3 9.5 9.0   GFR: Estimated Creatinine Clearance: 44 mL/min (A) (by C-G formula based on SCr of 1.21 mg/dL (H)). Liver Function Tests: Recent Labs  Lab 04/09/24 0938 04/10/24 0459 04/11/24 0433  AST 18 14* 14*  ALT 24 21 18   ALKPHOS 65 66 60  BILITOT 0.9 1.0 1.2  PROT 6.5 6.5 5.8*  ALBUMIN 3.9 3.0* 2.4*   Recent Labs  Lab 04/09/24 0938  LIPASE <10*   No results for input(s): AMMONIA in the last 168 hours. Coagulation Profile: No results for input(s): INR, PROTIME in the last 168 hours. Cardiac Enzymes: No results for input(s): CKTOTAL, CKMB, CKMBINDEX, TROPONINI in the last 168 hours. BNP (last 3 results) No results for input(s): PROBNP in the last 8760 hours. HbA1C: Recent Labs    04/09/24 1653  HGBA1C 5.7*   CBG: No results for input(s): GLUCAP in the last 168 hours. Lipid Profile: Recent Labs    04/10/24 0459  CHOL 198  HDL 82  LDLCALC 106*  TRIG 50  CHOLHDL 2.4   Thyroid Function Tests: No results for input(s): TSH, T4TOTAL, FREET4, T3FREE, THYROIDAB in the last 72 hours. Anemia Panel: No results for input(s): VITAMINB12, FOLATE, FERRITIN, TIBC, IRON, RETICCTPCT in the last 72 hours. Urine analysis:    Component Value Date/Time   COLORURINE YELLOW 04/09/2024 2000   APPEARANCEUR HAZY (A) 04/09/2024 2000   LABSPEC >1.046 (H) 04/09/2024 2000   PHURINE 5.0 04/09/2024  2000   GLUCOSEU NEGATIVE 04/09/2024 2000   HGBUR NEGATIVE 04/09/2024 2000   BILIRUBINUR NEGATIVE 04/09/2024 2000   BILIRUBINUR Negative 04/24/2020 0938   KETONESUR NEGATIVE 04/09/2024 2000   PROTEINUR NEGATIVE 04/09/2024 2000   UROBILINOGEN 1.0 04/24/2020 0938   NITRITE NEGATIVE 04/09/2024 2000   LEUKOCYTESUR NEGATIVE 04/09/2024 2000   Sepsis Labs: @LABRCNTIP (procalcitonin:4,lacticidven:4)  ) Recent Results (from the past 240 hours)  Culture, blood (Routine X 2) w Reflex to ID Panel     Status: None (Preliminary result)   Collection Time: 04/09/24  4:53 PM   Specimen: BLOOD  Result Value Ref Range Status   Specimen Description BLOOD SITE NOT SPECIFIED  Final   Special Requests   Final    BOTTLES DRAWN AEROBIC AND ANAEROBIC Blood Culture results may not be optimal due to an inadequate volume of blood received in culture bottles   Culture   Final    NO GROWTH 2 DAYS Performed at [facility], [address], [place], [place] [address]    Report Status PENDING  Incomplete  Culture, blood (Routine X 2) w Reflex to ID Panel     Status: None (Preliminary result)   Collection Time: 04/09/24  4:54 PM   Specimen: BLOOD  Result Value Ref Range Status  Specimen Description BLOOD SITE NOT SPECIFIED  Final   Special Requests   Final    BOTTLES DRAWN AEROBIC ONLY Blood Culture results may not be optimal due to an inadequate volume of blood received in culture bottles   Culture   Final    NO GROWTH 2 DAYS Performed at [facility], [address], [place], [place] [address]    Report Status PENDING  Incomplete      Studies: No results found.   Scheduled Meds:  Continuous Infusions:  sodium chloride 100 mL/hr at 04/11/24 9177   piperacillin-tazobactam 3.375 g (04/11/24 1806)     LOS: 2 days     Lebron JINNY Cage, MD [facility]  If 7PM-7AM, please contact night-coverage [email or web address] 04/11/2024, 6:21 PM

## 2024-04-12 ENCOUNTER — Inpatient Hospital Stay (HOSPITAL_COMMUNITY)

## 2024-04-12 DIAGNOSIS — R198 Other specified symptoms and signs involving the digestive system and abdomen: Secondary | ICD-10-CM | POA: Diagnosis not present

## 2024-04-12 LAB — CBC WITH DIFFERENTIAL/PLATELET
Abs Immature Granulocytes: 0.15 K/uL — ABNORMAL HIGH (ref 0.00–0.07)
Basophils Absolute: 0 K/uL (ref 0.0–0.1)
Basophils Relative: 0 %
Eosinophils Absolute: 0 K/uL (ref 0.0–0.5)
Eosinophils Relative: 0 %
HCT: 35.2 % — ABNORMAL LOW (ref 36.0–46.0)
Hemoglobin: 11.4 g/dL — ABNORMAL LOW (ref 12.0–15.0)
Immature Granulocytes: 1 %
Lymphocytes Relative: 3 %
Lymphs Abs: 0.6 K/uL — ABNORMAL LOW (ref 0.7–4.0)
MCH: 28.9 pg (ref 26.0–34.0)
MCHC: 32.4 g/dL (ref 30.0–36.0)
MCV: 89.3 fL (ref 80.0–100.0)
Monocytes Absolute: 1 K/uL (ref 0.1–1.0)
Monocytes Relative: 6 %
Neutro Abs: 16.5 K/uL — ABNORMAL HIGH (ref 1.7–7.7)
Neutrophils Relative %: 90 %
Platelets: 262 K/uL (ref 150–400)
RBC: 3.94 MIL/uL (ref 3.87–5.11)
RDW: 14.6 % (ref 11.5–15.5)
WBC: 18.3 K/uL — ABNORMAL HIGH (ref 4.0–10.5)
nRBC: 0 % (ref 0.0–0.2)

## 2024-04-12 LAB — COMPREHENSIVE METABOLIC PANEL WITH GFR
ALT: 18 U/L (ref 0–44)
AST: 15 U/L (ref 15–41)
Albumin: 2.3 g/dL — ABNORMAL LOW (ref 3.5–5.0)
Alkaline Phosphatase: 60 U/L (ref 38–126)
Anion gap: 15 (ref 5–15)
BUN: 28 mg/dL — ABNORMAL HIGH (ref 8–23)
CO2: 20 mmol/L — ABNORMAL LOW (ref 22–32)
Calcium: 8.6 mg/dL — ABNORMAL LOW (ref 8.9–10.3)
Chloride: 110 mmol/L (ref 98–111)
Creatinine, Ser: 1.03 mg/dL — ABNORMAL HIGH (ref 0.44–1.00)
GFR, Estimated: 58 mL/min — ABNORMAL LOW (ref >60)
Glucose, Bld: 106 mg/dL — ABNORMAL HIGH (ref 70–99)
Potassium: 3.7 mmol/L (ref 3.5–5.1)
Sodium: 145 mmol/L (ref 135–145)
Total Bilirubin: 1.4 mg/dL — ABNORMAL HIGH (ref 0.0–1.2)
Total Protein: 5.8 g/dL — ABNORMAL LOW (ref 6.5–8.1)

## 2024-04-12 MED ORDER — PROCHLORPERAZINE EDISYLATE 10 MG/2ML IJ SOLN
5.0000 mg | Freq: Four times a day (QID) | INTRAMUSCULAR | Status: DC | PRN
Start: 2024-04-12 — End: 2024-04-19

## 2024-04-12 MED ORDER — SODIUM CHLORIDE 0.9 % IV SOLN: INTRAVENOUS | Status: DC

## 2024-04-12 MED ORDER — ENOXAPARIN SODIUM 40 MG/0.4ML IJ SOSY
40.0000 mg | PREFILLED_SYRINGE | INTRAMUSCULAR | Status: DC
  Administered 2024-04-12 – 2024-04-18 (×7): 40 mg via SUBCUTANEOUS
  Filled 2024-04-12 (×7): qty 0.4

## 2024-04-12 MED ORDER — ACETAMINOPHEN 325 MG PO TABS
650.0000 mg | ORAL_TABLET | Freq: Four times a day (QID) | ORAL | Status: DC | PRN
Start: 2024-04-12 — End: 2024-04-19

## 2024-04-12 NOTE — Progress Notes (Signed)
 PROGRESS NOTE    Summer Watkins  FMW:[id number] DOB: Nov 11, 1950 DOA: 04/09/2024 PCP: Evangelina Tinnie Norris, PA-C  73/F with chronic systolic CHF, hypertension, prediabetes presented to [facility] ED with left lower quadrant abdominal pain nausea and vomiting, labs noted leukocytosis, lactic acidosis, CT abdomen pelvis noted left lower quadrant small bowel thickening with small amount of pneumoperitoneum, concerning for perforated viscus likely secondary to diverticulitis.  Made n.p.o., NG tube placed, started on IV Zosyn, general surgery following  Subjective: -Uncomfortable night, had a lot of nausea, vomited early this morning  Assessment and Plan:  Sepsis secondary to acute diverticulitis with localized perforation and small abscess - Continue IV fluids, Zosyn - General Surgery following, plan for repeat CT today> may end up needing resection and ostomy  Chronic systolic CHF Primary idiopathic dilated cardiomyopathy - Last echo at outside hospital in 8/25 noted EF of 30-35% - Heart failure meds including Lasix Entresto Coreg Aldactone on hold - Continue gentle IV fluids today, at risk of fluid overload  Hypertension - Meds as above  Incidental left ovarian mass - Needs further GYN workup as outpatient  Prediabetes A1c is 5.7  Class II obesity BMI 36.5  Mild AKI, prerenal Resolved   DVT prophylaxis: Add Lovenox Code Status: Full code Family Communication: No family at bedside, updated niece on telephone from patient's room Disposition Plan: Home pending improvement in above  Consultants:    Procedures:   Antimicrobials:    Objective: Vitals:   04/11/24 1535 04/11/24 1936 04/12/24 0007 04/12/24 0342  BP: 127/62 (!) 126/32 (!) 141/69 (!) 146/73  Pulse: 76 65 80 83  Resp: 16 16 18    Temp: 99 F (37.2 C) 98.2 F (36.8 C) 98.7 F (37.1 C) 98.5 F (36.9 C)  TempSrc: Oral Oral Oral Oral  SpO2: 90% 95% 96% 94%  Weight:      Height:         Intake/Output Summary (Last 24 hours) at 04/12/2024 1038 Last data filed at 04/12/2024 0600 Gross per 24 hour  Intake 2412.32 ml  Output 150 ml  Net 2262.32 ml   Filed Weights   04/09/24 0918 04/09/24 1520  Weight: 83.9 kg 90.5 kg    Examination:  General exam: Appears calm and comfortable, AO x 3 HEENT: Neck obese unable to assess JVD Respiratory system: Decreased breath sounds at the bases Cardiovascular system: S1 & S2 heard, RRR.  Abd: Soft, mildly distended, mild left lower quadrant tenderness, bowel sounds decreased Central nervous system: Alert and oriented. No focal neurological deficits. Extremities: no edema Skin: No rashes Psychiatry:  Mood & affect appropriate.     Data Reviewed:   CBC: Recent Labs  Lab 04/09/24 0938 04/10/24 0459 04/11/24 0433 04/12/24 0445  WBC 17.6* 22.3* 19.7* 18.3*  NEUTROABS  --   --  17.6* 16.5*  HGB 12.8 13.0 10.4* 11.4*  HCT 38.9 40.3 32.5* 35.2*  MCV 86.3 87.6 88.1 89.3  PLT 309 270 255 262   Basic Metabolic Panel: Recent Labs  Lab 04/09/24 0938 04/10/24 0459 04/11/24 0433 04/12/24 0445  NA 139 139 142 145  K 4.5 4.2 3.9 3.7  CL 103 104 104 110  CO2 23 25 24  20*  GLUCOSE 177* 136* 105* 106*  BUN 25* 26* 30* 28*  CREATININE 0.91 1.13* 1.21* 1.03*  CALCIUM  10.3 9.5 9.0 8.6*   GFR: Estimated Creatinine Clearance: 51.7 mL/min (A) (by C-G formula based on SCr of 1.03 mg/dL (H)). Liver Function Tests: Recent Labs  Lab 04/09/24  9061 04/10/24 0459 04/11/24 0433 04/12/24 0445  AST 18 14* 14* 15  ALT 24 21 18 18   ALKPHOS 65 66 60 60  BILITOT 0.9 1.0 1.2 1.4*  PROT 6.5 6.5 5.8* 5.8*  ALBUMIN 3.9 3.0* 2.4* 2.3*   Recent Labs  Lab 04/09/24 0938  LIPASE <10*   No results for input(s): AMMONIA in the last 168 hours. Coagulation Profile: No results for input(s): INR, PROTIME in the last 168 hours. Cardiac Enzymes: No results for input(s): CKTOTAL, CKMB, CKMBINDEX, TROPONINI in the last 168  hours. BNP (last 3 results) No results for input(s): PROBNP in the last 8760 hours. HbA1C: Recent Labs    04/09/24 1653  HGBA1C 5.7*   CBG: No results for input(s): GLUCAP in the last 168 hours. Lipid Profile: Recent Labs    04/10/24 0459  CHOL 198  HDL 82  LDLCALC 106*  TRIG 50  CHOLHDL 2.4   Thyroid Function Tests: No results for input(s): TSH, T4TOTAL, FREET4, T3FREE, THYROIDAB in the last 72 hours. Anemia Panel: No results for input(s): VITAMINB12, FOLATE, FERRITIN, TIBC, IRON, RETICCTPCT in the last 72 hours. Urine analysis:    Component Value Date/Time   COLORURINE YELLOW 04/09/2024 2000   APPEARANCEUR HAZY (A) 04/09/2024 2000   LABSPEC >1.046 (H) 04/09/2024 2000   PHURINE 5.0 04/09/2024 2000   GLUCOSEU NEGATIVE 04/09/2024 2000   HGBUR NEGATIVE 04/09/2024 2000   BILIRUBINUR NEGATIVE 04/09/2024 2000   BILIRUBINUR Negative 04/24/2020 0938   KETONESUR NEGATIVE 04/09/2024 2000   PROTEINUR NEGATIVE 04/09/2024 2000   UROBILINOGEN 1.0 04/24/2020 0938   NITRITE NEGATIVE 04/09/2024 2000   LEUKOCYTESUR NEGATIVE 04/09/2024 2000   Sepsis Labs: @LABRCNTIP (procalcitonin:4,lacticidven:4)  ) Recent Results (from the past 240 hours)  Culture, blood (Routine X 2) w Reflex to ID Panel     Status: None (Preliminary result)   Collection Time: 04/09/24  4:53 PM   Specimen: BLOOD  Result Value Ref Range Status   Specimen Description BLOOD SITE NOT SPECIFIED  Final   Special Requests   Final    BOTTLES DRAWN AEROBIC AND ANAEROBIC Blood Culture results may not be optimal due to an inadequate volume of blood received in culture bottles   Culture   Final    NO GROWTH 3 DAYS Performed at [facility], [address], [place], [place] [address]    Report Status PENDING  Incomplete  Culture, blood (Routine X 2) w Reflex to ID Panel     Status: None (Preliminary result)   Collection Time: 04/09/24  4:54 PM   Specimen: BLOOD  Result Value Ref Range  Status   Specimen Description BLOOD SITE NOT SPECIFIED  Final   Special Requests   Final    BOTTLES DRAWN AEROBIC ONLY Blood Culture results may not be optimal due to an inadequate volume of blood received in culture bottles   Culture   Final    NO GROWTH 3 DAYS Performed at [facility], [address], [place], [place] [address]    Report Status PENDING  Incomplete     Radiology Studies: No results found.   Scheduled Meds: Continuous Infusions:  piperacillin-tazobactam 3.375 g (04/12/24 0944)     LOS: 3 days    Time spent:    Sigurd Pac, MD [facility]   04/12/2024, 10:38 AM

## 2024-04-12 NOTE — Evaluation (Signed)
 Occupational Therapy Evaluation Patient Details Name: Summer Watkins MRN: [id number] DOB: [birth year] Today's Date: 04/12/2024   History of Present Illness   Summer Watkins is a 73 y.o. female with medical history significant of HFrEF, hypertension, hyperlipidemia, prediabetes, presents to [facility] ED complaining of significant left lower quadrant abdominal pain, nausea/vomiting, diarrhea for 1 day, which started on 04/08/2024.  Patient describes abdominal pain as sharp, sudden onset, no radiation.  Reported associated nausea with vomiting recently ingested food, denies seeing any blood.  Reported 1 episode of loose stool.  Denies ever having similar symptoms in the past.  Patient denies any chest pain, shortness of breath, fever/chills, dizziness, diaphoresis.  In [facility] ED, initial vital signs otherwise stable, currently now with some tachycardia.  Labs showed WBC 17.6, noted lactic acidosis 2.1-2.6, otherwise unremarkable.  CT abdomen/pelvis showed possible left lower quadrant bowel wall thickening with some small amount of pneumoperitoneum with findings concerning for perforated viscus likely 2/2 small bowel enteritis.  Patient was given IV morphine, IV fluid and started on IV Zosyn.  Patient was made n.p.o. and an NG tube was placed.     Clinical Impressions Pt presents with decline in function and safety with ADLs and ADL mobility with impaired balance and endurance. PTA lives alone in a 1 level home with 5 STE, was Ind with ADLs, IADLs, home mgt, drives and used no AD for mobility. Pt currently limited in bed mobility and LB ADLs due to 5/10 c/o ADB pain, requiring min A to sit EOB, min A with LB ADLs and Sup with mobility using RW and HHA. Pt's O2 SATs 93% and HR 101-103. OT will follow acutely to maximize level of function and safety     If plan is discharge home, recommend the following:   A little help with bathing/dressing/bathroom;Assistance with  cooking/housework;Assist for transportation;Help with stairs or ramp for entrance     Functional Status Assessment   Patient has had a recent decline in their functional status and demonstrates the ability to make significant improvements in function in a reasonable and predictable amount of time.     Equipment Recommendations   Other (comment) (LH bath sponge, reacher, sock aid)     Recommendations for Other Services   PT consult     Precautions/Restrictions   Precautions Precautions: None Restrictions Weight Bearing Restrictions Per Provider Order: No     Mobility Bed Mobility Overal bed mobility: Needs Assistance Bed Mobility: Supine to Sit     Supine to sit: Min assist     General bed mobility comments: min A to elevate trunk due to ABD pain, used log roll technique    Transfers Overall transfer level: Needs assistance Equipment used: Rolling walker (2 wheels), 1 person hand held assist Transfers: Sit to/from Stand, Bed to chair/wheelchair/BSC Sit to Stand: Supervision           General transfer comment: Sup for safety      Balance Overall balance assessment: No apparent balance deficits (not formally assessed)                                         ADL either performed or assessed with clinical judgement   ADL Overall ADL's : Needs assistance/impaired Eating/Feeding: Independent   Grooming: Wash/dry hands;Wash/dry face;Oral care;Modified independent;Standing   Upper Body Bathing: Modified independent   Lower Body Bathing: Minimal assistance  Upper Body Dressing : Modified independent   Lower Body Dressing: Minimal assistance   Toilet Transfer: Supervision/safety;Ambulation;Regular Toilet;Grab bars   Toileting- Clothing Manipulation and Hygiene: Modified independent;Sit to/from stand   Tub/ Shower Transfer: Supervision/safety;Ambulation;Grab bars   Functional mobility during ADLs: Supervision/safety General ADL  Comments: min A with LB ADLs due to ABD pain     Vision Baseline Vision/History: 1 Wears glasses Ability to See in Adequate Light: 0 Adequate Patient Visual Report: No change from baseline       Perception         Praxis         Pertinent Vitals/Pain Pain Assessment Pain Assessment: 0-10 Pain Score: 5  Pain Location: ABD Pain Descriptors / Indicators: Sore Pain Intervention(s): Monitored during session, Repositioned     Extremity/Trunk Assessment Upper Extremity Assessment Upper Extremity Assessment: Overall WFL for tasks assessed;Right hand dominant           Communication Communication Communication: No apparent difficulties   Cognition Arousal: Alert Behavior During Therapy: WFL for tasks assessed/performed Cognition: No apparent impairments                               Following commands: Intact       Cueing  General Comments          Exercises     Shoulder Instructions      Home Living Family/patient expects to be discharged to:: Private residence Living Arrangements: Alone Available Help at Discharge: Family;Available PRN/intermittently Type of Home: House Home Access: Stairs to enter Entrance Stairs-Number of Steps: 5   Home Layout: One level     Bathroom Shower/Tub: Tub/shower unit;Walk-in shower   Bathroom Toilet: Handicapped height     Home Equipment: Grab bars - tub/shower          Prior Functioning/Environment Prior Level of Function : Independent/Modified Independent;Driving             Mobility Comments: no ADs for mobility ADLs Comments: Ind with ADLs, IADLs, home mgt    OT Problem List: Decreased activity tolerance;Pain   OT Treatment/Interventions: Self-care/ADL training;Patient/family education;Therapeutic activities;DME and/or AE instruction      OT Goals(Current goals can be found in the care plan section)   Acute Rehab OT Goals Patient Stated Goal: go home OT Goal Formulation: With  patient Time For Goal Achievement: 04/26/24 Potential to Achieve Goals: Good ADL Goals Pt Will Perform Lower Body Bathing: with contact guard assist;with supervision;with adaptive equipment Pt Will Perform Lower Body Dressing: with contact guard assist;with supervision;with adaptive equipment Pt Will Transfer to Toilet: with modified independence;ambulating Pt Will Perform Tub/Shower Transfer: with modified independence;ambulating   OT Frequency:  Min 2X/week    Co-evaluation              AM-PAC OT 6 Clicks Daily Activity     Outcome Measure Help from another person eating meals?: None Help from another person taking care of personal grooming?: None Help from another person toileting, which includes using toliet, bedpan, or urinal?: A Little Help from another person bathing (including washing, rinsing, drying)?: A Little Help from another person to put on and taking off regular upper body clothing?: None Help from another person to put on and taking off regular lower body clothing?: A Little 6 Click Score: 21   End of Session Equipment Utilized During Treatment: Gait belt;Rolling walker (2 wheels) Nurse Communication: Mobility status  Activity Tolerance: Patient tolerated treatment well  Patient left: in chair;with call bell/phone within reach;with family/visitor present  OT Visit Diagnosis: Pain;Unsteadiness on feet (R26.81) Pain - part of body:  (ABD)                Time: 8952-8887 OT Time Calculation (min): 25 min Charges:  OT General Charges $OT Visit: 1 Visit OT Evaluation $OT Eval Low Complexity: 1 Low OT Treatments $Self Care/Home Management : 8-22 mins    Summer Watkins [facility] 04/12/2024, 11:30 AM

## 2024-04-12 NOTE — Progress Notes (Signed)
   Subjective/Chief Complaint: Emesis overnight, but now has passed some flatus, no real ab pain this am   Objective: Vital signs in last 24 hours: Temp:  [98.2 F (36.8 C)-99.1 F (37.3 C)] 98.5 F (36.9 C) (11/12 0342) Pulse Rate:  [65-83] 83 (11/12 0342) Resp:  [16-18] 18 (11/12 0007) BP: (126-146)/(32-73) 146/73 (11/12 0342) SpO2:  [90 %-96 %] 94 % (11/12 0342) Last BM Date : 04/09/24  Intake/Output from previous day: 11/11 0701 - 11/12 0700 In: 2412.3 [I.V.:2163.2; IV Piggyback:249.1] Out: 150 [Emesis/NG output:150] Intake/Output this shift: No intake/output data recorded.  Ab: soft nontender today nondistended  Lab Results:  Recent Labs    04/11/24 0433 04/12/24 0445  WBC 19.7* 18.3*  HGB 10.4* 11.4*  HCT 32.5* 35.2*  PLT 255 262   BMET Recent Labs    04/11/24 0433 04/12/24 0445  NA 142 145  K 3.9 3.7  CL 104 110  CO2 24 20*  GLUCOSE 105* 106*  BUN 30* 28*  CREATININE 1.21* 1.03*  CALCIUM  9.0 8.6*   PT/INR No results for input(s): LABPROT, INR in the last 72 hours. ABG No results for input(s): PHART, HCO3 in the last 72 hours.  Invalid input(s): PCO2, PO2  Studies/Results: No results found.  Anti-infectives: Anti-infectives (From admission, onward)    Start     Dose/Rate Route Frequency Ordered Stop   04/09/24 1800  piperacillin-tazobactam (ZOSYN) IVPB 3.375 g  Status:  Discontinued       Placed in Followed by Linked Group   3.375 g 12.5 mL/hr over 240 Minutes Intravenous Every 8 hours 04/09/24 1109 04/09/24 1123   04/09/24 1800  piperacillin-tazobactam (ZOSYN) IVPB 3.375 g       Placed in Followed by Linked Group   3.375 g 12.5 mL/hr over 240 Minutes Intravenous Every 8 hours 04/09/24 1201     04/09/24 1215  piperacillin-tazobactam (ZOSYN) IVPB 3.375 g  Status:  Discontinued        3.375 g 100 mL/hr over 30 Minutes Intravenous  Once 04/09/24 1200 04/09/24 1201   04/09/24 1215  piperacillin-tazobactam (ZOSYN) IVPB  3.375 g       Placed in Followed by Linked Group   3.375 g 100 mL/hr over 30 Minutes Intravenous  Once 04/09/24 1201 04/09/24 1302   04/09/24 1115  piperacillin-tazobactam (ZOSYN) IVPB 3.375 g  Status:  Discontinued       Placed in Followed by Linked Group   3.375 g 100 mL/hr over 30 Minutes Intravenous  Once 04/09/24 1109 04/09/24 1123       Assessment/Plan: Perforated Diverticulitis - Agree with consult note that I suspect this is diverticulitis of the sigmoid colon with localized perforation with concern for small abscess and distal free air. Suspect small bowel changes on CT are reactive.  - Cont abx - appears to be opening up and clinically looks better today but wbc still up and with emesis will repeat ct scan todayresection and ostomy.  This was discussed with the patient. - If patient improves with conservative therapies would recommend colonoscopy in ~6-8 weeks.     FEN - Sips and chips. IVF per [facility].  VTE - SCDs, okay for chem ppx from a general surgery standpoint ID - Zosyn   - Per [facility] -  HFrEF Primary idiopathic dilated cardiomyopathy Hypertension  Hyperlipidemia  Prediabetes  Incidental findings - Left ovarian mass. Aortic Atherosclerosis   Summer Watkins 04/12/2024

## 2024-04-13 DIAGNOSIS — R198 Other specified symptoms and signs involving the digestive system and abdomen: Secondary | ICD-10-CM | POA: Diagnosis not present

## 2024-04-13 LAB — CBC
HCT: 36.3 % (ref 36.0–46.0)
Hemoglobin: 11.6 g/dL — ABNORMAL LOW (ref 12.0–15.0)
MCH: 28.3 pg (ref 26.0–34.0)
MCHC: 32 g/dL (ref 30.0–36.0)
MCV: 88.5 fL (ref 80.0–100.0)
Platelets: 292 K/uL (ref 150–400)
RBC: 4.1 MIL/uL (ref 3.87–5.11)
RDW: 14.7 % (ref 11.5–15.5)
WBC: 12.5 K/uL — ABNORMAL HIGH (ref 4.0–10.5)
nRBC: 0 % (ref 0.0–0.2)

## 2024-04-13 LAB — BASIC METABOLIC PANEL WITH GFR
Anion gap: 10 (ref 5–15)
BUN: 25 mg/dL — ABNORMAL HIGH (ref 8–23)
CO2: 21 mmol/L — ABNORMAL LOW (ref 22–32)
Calcium: 8.6 mg/dL — ABNORMAL LOW (ref 8.9–10.3)
Chloride: 114 mmol/L — ABNORMAL HIGH (ref 98–111)
Creatinine, Ser: 1.04 mg/dL — ABNORMAL HIGH (ref 0.44–1.00)
GFR, Estimated: 57 mL/min — ABNORMAL LOW (ref >60)
Glucose, Bld: 93 mg/dL (ref 70–99)
Potassium: 3.8 mmol/L (ref 3.5–5.1)
Sodium: 145 mmol/L (ref 135–145)

## 2024-04-13 MED ORDER — SPIRONOLACTONE 12.5 MG HALF TABLET
12.5000 mg | ORAL_TABLET | Freq: Every day | ORAL | Status: DC
  Administered 2024-04-13 – 2024-04-19 (×7): 12.5 mg via ORAL
  Filled 2024-04-13 (×7): qty 1

## 2024-04-13 MED ORDER — SACUBITRIL-VALSARTAN 24-26 MG PO TABS
1.0000 | ORAL_TABLET | Freq: Two times a day (BID) | ORAL | Status: DC
  Administered 2024-04-13 – 2024-04-19 (×13): 1 via ORAL
  Filled 2024-04-13 (×13): qty 1

## 2024-04-13 MED ORDER — PIPERACILLIN-TAZOBACTAM 3.375 G IVPB
3.3750 g | Freq: Three times a day (TID) | INTRAVENOUS | Status: DC
  Administered 2024-04-13 – 2024-04-17 (×11): 3.375 g via INTRAVENOUS
  Filled 2024-04-13 (×14): qty 50

## 2024-04-13 MED ORDER — CARVEDILOL 6.25 MG PO TABS
6.2500 mg | ORAL_TABLET | Freq: Two times a day (BID) | ORAL | Status: DC
  Administered 2024-04-13 – 2024-04-19 (×13): 6.25 mg via ORAL
  Filled 2024-04-13 (×14): qty 1

## 2024-04-13 NOTE — Progress Notes (Signed)
 Heart Failure Navigator Progress Note  Assessed for Heart & Vascular TOC clinic readiness.  Patient does not meet criteria due to pateint is seen by [facility]. No HF TOC. .   Navigator will sign off at this time.   Stephane Haddock, BSN, Scientist, Clinical (histocompatibility And Immunogenetics) Only

## 2024-04-13 NOTE — Plan of Care (Signed)
  Problem: Education: Goal: Knowledge of General Education information will improve Description: Including pain rating scale, medication(s)/side effects and non-pharmacologic comfort measures Outcome: Progressing   Problem: Health Behavior/Discharge Planning: Goal: Ability to manage health-related needs will improve Outcome: Progressing   Problem: Clinical Measurements: Goal: Ability to maintain clinical measurements within normal limits will improve Outcome: Progressing Goal: Will remain free from infection Outcome: Progressing Goal: Diagnostic test results will improve Outcome: Progressing Goal: Respiratory complications will improve Outcome: Progressing Goal: Cardiovascular complication will be avoided Outcome: Progressing   Problem: Activity: Goal: Risk for activity intolerance will decrease Outcome: Progressing   Problem: Nutrition: Goal: Adequate nutrition will be maintained Outcome: Progressing   Problem: Coping: Goal: Level of anxiety will decrease Outcome: Progressing   Problem: Elimination: Goal: Will not experience complications related to bowel motility Outcome: Progressing Goal: Will not experience complications related to urinary retention Outcome: Progressing   Problem: Pain Managment: Goal: General experience of comfort will improve and/or be controlled Outcome: Progressing   Problem: Safety: Goal: Ability to remain free from injury will improve Outcome: Progressing   Problem: Skin Integrity: Goal: Risk for impaired skin integrity will decrease Outcome: Progressing   Problem: Education: Goal: Ability to demonstrate management of disease process will improve Outcome: Progressing Goal: Ability to verbalize understanding of medication therapies will improve Outcome: Progressing Goal: Individualized Educational Video(s) Outcome: Progressing   Problem: Activity: Goal: Capacity to carry out activities will improve Outcome: Progressing    Problem: Tissue Perfusion: Goal: Hemodynamically stable with effective tissue perfusion will improve Outcome: Progressing Goal: Postoperative complications will be avoided or minimized Outcome: Progressing   Problem: Bowel/Gastric: Goal: Gastrointestinal status for postoperative course will improve Outcome: Progressing Goal: GI tract motility and GI tissue perfusion will improve Outcome: Progressing Goal: Ability to demonstrate the techniques of an individualized bowel program will improve Outcome: Progressing   Problem: Education: Goal: Knowledge of the prescribed therapeutic regimen will improve Outcome: Progressing Goal: Knowledge of injury and care (of blunt abdominal trauma) will improve Outcome: Progressing   Problem: Coping: Goal: Exhibits appropriate coping mechanism and reduced anxiety resulting from physical and emotional stress Outcome: Progressing

## 2024-04-13 NOTE — Progress Notes (Addendum)
 PROGRESS NOTE    Summer Watkins  FMW:[id number] DOB: [birth year] DOA: 04/09/2024 PCP: Evangelina Tinnie Norris, PA-C  73/F with chronic systolic CHF, hypertension, prediabetes presented to [facility] ED with left lower quadrant abdominal pain nausea and vomiting, labs noted leukocytosis, lactic acidosis, CT abdomen pelvis noted left lower quadrant small bowel thickening with small amount of pneumoperitoneum, concerning for perforated viscus likely secondary to diverticulitis.  Made n.p.o., NG tube placed, started on IV Zosyn, general surgery following  Subjective: - Repeat CT yesterday reassuring, feels better today overall, some flatus, only mild abdominal discomfort, no further vomiting  Assessment and Plan:  Acute diverticulitis with localized perforation and small abscess Sepsis, POA -Clinically improving, continue IV Zosyn, discontinue IV fluids today  - CCS following, repeat CT yesterday largely stable  -Start clears if okay with CCS, ambulate - Will need repeat colonoscopy in near future  Chronic systolic CHF Primary idiopathic dilated cardiomyopathy - Last echo at outside hospital in 8/25 noted EF of 30-35% - Heart failure meds including Lasix Entresto Coreg Aldactone on hold - Discontinuing IV fluids today, weight has trended up, restart Entresto Aldactone and Coreg  Hypertension - Meds as above  Incidental left ovarian mass - Needs further GYN workup as outpatient  Prediabetes A1c is 5.7  Class II obesity BMI 36.5  Mild AKI, prerenal Resolved   DVT prophylaxis: Add Lovenox Code Status: Full code Family Communication: No family at bedside, updated niece on telephone from patient's room Disposition Plan: Home pending improvement in above  Consultants:    Procedures:   Antimicrobials:    Objective: Vitals:   04/12/24 1946 04/12/24 2258 04/13/24 0346 04/13/24 0728  BP: 129/63 128/64 130/60 (!) 142/61  Pulse: 66 80 69 67  Resp: 18 20 18 19    Temp: 98.1 F (36.7 C) 98.5 F (36.9 C) 97.9 F (36.6 C) 97.6 F (36.4 C)  TempSrc: Oral Oral Oral Oral  SpO2: 94% 95% 92% 97%  Weight:      Height:        Intake/Output Summary (Last 24 hours) at 04/13/2024 0953 Last data filed at 04/13/2024 0348 Gross per 24 hour  Intake 0 ml  Output --  Net 0 ml   Filed Weights   04/09/24 0918 04/09/24 1520  Weight: 83.9 kg 90.5 kg    Examination:  General exam: Appears calm and comfortable, AO x 3 HEENT: Neck obese unable to assess JVD Respiratory system: Decreased breath sounds at the bases Cardiovascular system: S1 & S2 heard, RRR.  Abd: Soft, mildly distended, mild left lower quadrant tenderness, bowel sounds decreased Central nervous system: Alert and oriented. No focal neurological deficits. Extremities: no edema Skin: No rashes Psychiatry:  Mood & affect appropriate.     Data Reviewed:   CBC: Recent Labs  Lab 04/09/24 0938 04/10/24 0459 04/11/24 0433 04/12/24 0445 04/13/24 0412  WBC 17.6* 22.3* 19.7* 18.3* 12.5*  NEUTROABS  --   --  17.6* 16.5*  --   HGB 12.8 13.0 10.4* 11.4* 11.6*  HCT 38.9 40.3 32.5* 35.2* 36.3  MCV 86.3 87.6 88.1 89.3 88.5  PLT 309 270 255 262 292   Basic Metabolic Panel: Recent Labs  Lab 04/09/24 0938 04/10/24 0459 04/11/24 0433 04/12/24 0445 04/13/24 0412  NA 139 139 142 145 145  K 4.5 4.2 3.9 3.7 3.8  CL 103 104 104 110 114*  CO2 23 25 24  20* 21*  GLUCOSE 177* 136* 105* 106* 93  BUN 25* 26* 30* 28* 25*  CREATININE  0.91 1.13* 1.21* 1.03* 1.04*  CALCIUM  10.3 9.5 9.0 8.6* 8.6*   GFR: Estimated Creatinine Clearance: 51.2 mL/min (A) (by C-G formula based on SCr of 1.04 mg/dL (H)). Liver Function Tests: Recent Labs  Lab 04/09/24 0938 04/10/24 0459 04/11/24 0433 04/12/24 0445  AST 18 14* 14* 15  ALT 24 21 18 18   ALKPHOS 65 66 60 60  BILITOT 0.9 1.0 1.2 1.4*  PROT 6.5 6.5 5.8* 5.8*  ALBUMIN 3.9 3.0* 2.4* 2.3*   Recent Labs  Lab 04/09/24 0938  LIPASE <10*   No  results for input(s): AMMONIA in the last 168 hours. Coagulation Profile: No results for input(s): INR, PROTIME in the last 168 hours. Cardiac Enzymes: No results for input(s): CKTOTAL, CKMB, CKMBINDEX, TROPONINI in the last 168 hours. BNP (last 3 results) No results for input(s): PROBNP in the last 8760 hours. HbA1C: No results for input(s): HGBA1C in the last 72 hours.  CBG: No results for input(s): GLUCAP in the last 168 hours. Lipid Profile: No results for input(s): CHOL, HDL, LDLCALC, TRIG, CHOLHDL, LDLDIRECT in the last 72 hours.  Thyroid Function Tests: No results for input(s): TSH, T4TOTAL, FREET4, T3FREE, THYROIDAB in the last 72 hours. Anemia Panel: No results for input(s): VITAMINB12, FOLATE, FERRITIN, TIBC, IRON, RETICCTPCT in the last 72 hours. Urine analysis:    Component Value Date/Time   COLORURINE YELLOW 04/09/2024 2000   APPEARANCEUR HAZY (A) 04/09/2024 2000   LABSPEC >1.046 (H) 04/09/2024 2000   PHURINE 5.0 04/09/2024 2000   GLUCOSEU NEGATIVE 04/09/2024 2000   HGBUR NEGATIVE 04/09/2024 2000   BILIRUBINUR NEGATIVE 04/09/2024 2000   BILIRUBINUR Negative 04/24/2020 0938   KETONESUR NEGATIVE 04/09/2024 2000   PROTEINUR NEGATIVE 04/09/2024 2000   UROBILINOGEN 1.0 04/24/2020 0938   NITRITE NEGATIVE 04/09/2024 2000   LEUKOCYTESUR NEGATIVE 04/09/2024 2000   Sepsis Labs: @LABRCNTIP (procalcitonin:4,lacticidven:4)  ) Recent Results (from the past 240 hours)  Culture, blood (Routine X 2) w Reflex to ID Panel     Status: None (Preliminary result)   Collection Time: 04/09/24  4:53 PM   Specimen: BLOOD  Result Value Ref Range Status   Specimen Description BLOOD SITE NOT SPECIFIED  Final   Special Requests   Final    BOTTLES DRAWN AEROBIC AND ANAEROBIC Blood Culture results may not be optimal due to an inadequate volume of blood received in culture bottles   Culture   Final    NO GROWTH 4 DAYS Performed at  [facility], [address] [place], [place] [address]    Report Status PENDING  Incomplete  Culture, blood (Routine X 2) w Reflex to ID Panel     Status: None (Preliminary result)   Collection Time: 04/09/24  4:54 PM   Specimen: BLOOD  Result Value Ref Range Status   Specimen Description BLOOD SITE NOT SPECIFIED  Final   Special Requests   Final    BOTTLES DRAWN AEROBIC ONLY Blood Culture results may not be optimal due to an inadequate volume of blood received in culture bottles   Culture   Final    NO GROWTH 4 DAYS Performed at [facility], [address], [place], [place] [address]    Report Status PENDING  Incomplete     Radiology Studies: CT ABDOMEN PELVIS WO CONTRAST Result Date: 04/12/2024 CLINICAL DATA:  Diverticulitis complication suspected. EXAM: CT ABDOMEN AND PELVIS WITHOUT CONTRAST TECHNIQUE: Multidetector CT imaging of the abdomen and pelvis was performed following the standard protocol without IV contrast. RADIATION DOSE  REDUCTION: This exam was performed according to the departmental dose-optimization program which includes automated exposure control, adjustment of the mA and/or kV according to patient size and/or use of iterative reconstruction technique. COMPARISON:  CT abdomen pelvis dated 04/09/2024. FINDINGS: Evaluation of this exam is limited in the absence of intravenous contrast. Lower chest: Trace bilateral pleural effusions. Small pneumoperitoneum, relatively similar volume as the prior CT. There is small free fluid in the pelvis. Hepatobiliary: The liver is unremarkable. No biliary dilatation. The gallbladder is unremarkable. Pancreas: Unremarkable. No pancreatic ductal dilatation or surrounding inflammatory changes. Spleen: Normal in size without focal abnormality. Adrenals/Urinary Tract: The adrenal glands unremarkable. There is no hydronephrosis or nephrolithiasis on either side. The visualized ureters and urinary bladder appear unremarkable.  Stomach/Bowel: Mild distal colonic diverticulosis. Mildly dilated small bowel in the anterior abdomen measures 3.4 cm in caliber, likely a reactive ileus. There is thickened appearance of several small bowel loops in the left hemiabdomen with infiltration of the associated mesentery suspicious for enteritis. Overall slight interval progression of the inflammatory process since the prior CT. The appendix is normal. Vascular/Lymphatic: Mild atherosclerotic calcification of the iliac arteries. The abdominal aorta and IVC are grossly unremarkable. No portal venous gas. There is no adenopathy. Reproductive: Hysterectomy. Other: None Musculoskeletal: No acute or significant osseous findings. IMPRESSION: 1. Small pneumoperitoneum, relatively similar volume as the prior CT. 2. Findings suspicious for enteritis in the left hemiabdomen, slightly progressed since the prior CT. 3. Mildly dilated small bowel in the anterior abdomen, likely a reactive ileus. 4. Trace bilateral pleural effusions. Electronically Signed   By: Vanetta Chou M.D.   On: 04/12/2024 14:29     Scheduled Meds:  enoxaparin (LOVENOX) injection  40 mg Subcutaneous Q24H   sacubitril-valsartan  1 tablet Oral BID   spironolactone  12.5 mg Oral Daily   Continuous Infusions:  piperacillin-tazobactam 3.375 g (04/13/24 0123)     LOS: 4 days    Time spent:    Sigurd Pac, MD [facility]   04/13/2024, 9:53 AM

## 2024-04-13 NOTE — Progress Notes (Addendum)
 Subjective: CC: Discussed her CT scan results.  LLQ pain improved, 5/10 today. No n/v. Passing flatus. No BM.   Objective: Vital signs in last 24 hours: Temp:  [97.6 F (36.4 C)-98.5 F (36.9 C)] 97.6 F (36.4 C) (11/13 0728) Pulse Rate:  [66-80] 67 (11/13 0728) Resp:  [15-20] 19 (11/13 0728) BP: (128-142)/(60-65) 142/61 (11/13 0728) SpO2:  [92 %-98 %] 97 % (11/13 0728) Last BM Date : 04/11/24  Intake/Output from previous day: No intake/output data recorded. Intake/Output this shift: No intake/output data recorded.  PE: Gen:  Alert, NAD, pleasant Abd: Soft, ND, mild LLQ ttp that is improved and without rigidity or guarding.  Lab Results:  Recent Labs    04/12/24 0445 04/13/24 0412  WBC 18.3* 12.5*  HGB 11.4* 11.6*  HCT 35.2* 36.3  PLT 262 292   BMET Recent Labs    04/12/24 0445 04/13/24 0412  NA 145 145  K 3.7 3.8  CL 110 114*  CO2 20* 21*  GLUCOSE 106* 93  BUN 28* 25*  CREATININE 1.03* 1.04*  CALCIUM  8.6* 8.6*   PT/INR No results for input(s): LABPROT, INR in the last 72 hours. CMP     Component Value Date/Time   NA 145 04/13/2024 0412   NA 143 04/24/2020 0945   K 3.8 04/13/2024 0412   CL 114 (H) 04/13/2024 0412   CO2 21 (L) 04/13/2024 0412   GLUCOSE 93 04/13/2024 0412   BUN 25 (H) 04/13/2024 0412   BUN 15 04/24/2020 0945   CREATININE 1.04 (H) 04/13/2024 0412   CALCIUM  8.6 (L) 04/13/2024 0412   PROT 5.8 (L) 04/12/2024 0445   PROT 6.7 04/24/2020 0945   ALBUMIN 2.3 (L) 04/12/2024 0445   ALBUMIN 4.1 04/24/2020 0945   AST 15 04/12/2024 0445   ALT 18 04/12/2024 0445   ALKPHOS 60 04/12/2024 0445   BILITOT 1.4 (H) 04/12/2024 0445   BILITOT 0.5 04/24/2020 0945   GFRNONAA 57 (L) 04/13/2024 0412   GFRAA 95 04/24/2020 0945   Lipase     Component Value Date/Time   LIPASE <10 (L) 04/09/2024 0938    Studies/Results: CT ABDOMEN PELVIS WO CONTRAST Result Date: 04/12/2024 CLINICAL DATA:  Diverticulitis complication suspected. EXAM:  CT ABDOMEN AND PELVIS WITHOUT CONTRAST TECHNIQUE: Multidetector CT imaging of the abdomen and pelvis was performed following the standard protocol without IV contrast. RADIATION DOSE REDUCTION: This exam was performed according to the departmental dose-optimization program which includes automated exposure control, adjustment of the mA and/or kV according to patient size and/or use of iterative reconstruction technique. COMPARISON:  CT abdomen pelvis dated 04/09/2024. FINDINGS: Evaluation of this exam is limited in the absence of intravenous contrast. Lower chest: Trace bilateral pleural effusions. Small pneumoperitoneum, relatively similar volume as the prior CT. There is small free fluid in the pelvis. Hepatobiliary: The liver is unremarkable. No biliary dilatation. The gallbladder is unremarkable. Pancreas: Unremarkable. No pancreatic ductal dilatation or surrounding inflammatory changes. Spleen: Normal in size without focal abnormality. Adrenals/Urinary Tract: The adrenal glands unremarkable. There is no hydronephrosis or nephrolithiasis on either side. The visualized ureters and urinary bladder appear unremarkable. Stomach/Bowel: Mild distal colonic diverticulosis. Mildly dilated small bowel in the anterior abdomen measures 3.4 cm in caliber, likely a reactive ileus. There is thickened appearance of several small bowel loops in the left hemiabdomen with infiltration of the associated mesentery suspicious for enteritis. Overall slight interval progression of the inflammatory process since the prior CT. The appendix is normal. Vascular/Lymphatic: Mild atherosclerotic  calcification of the iliac arteries. The abdominal aorta and IVC are grossly unremarkable. No portal venous gas. There is no adenopathy. Reproductive: Hysterectomy. Other: None Musculoskeletal: No acute or significant osseous findings. IMPRESSION: 1. Small pneumoperitoneum, relatively similar volume as the prior CT. 2. Findings suspicious for  enteritis in the left hemiabdomen, slightly progressed since the prior CT. 3. Mildly dilated small bowel in the anterior abdomen, likely a reactive ileus. 4. Trace bilateral pleural effusions. Electronically Signed   By: Vanetta Chou M.D.   On: 04/12/2024 14:29    Anti-infectives: Anti-infectives (From admission, onward)    Start     Dose/Rate Route Frequency Ordered Stop   04/09/24 1800  piperacillin-tazobactam (ZOSYN) IVPB 3.375 g  Status:  Discontinued       Placed in Followed by Linked Group   3.375 g 12.5 mL/hr over 240 Minutes Intravenous Every 8 hours 04/09/24 1109 04/09/24 1123   04/09/24 1800  piperacillin-tazobactam (ZOSYN) IVPB 3.375 g       Placed in Followed by Linked Group   3.375 g 12.5 mL/hr over 240 Minutes Intravenous Every 8 hours 04/09/24 1201     04/09/24 1215  piperacillin-tazobactam (ZOSYN) IVPB 3.375 g  Status:  Discontinued        3.375 g 100 mL/hr over 30 Minutes Intravenous  Once 04/09/24 1200 04/09/24 1201   04/09/24 1215  piperacillin-tazobactam (ZOSYN) IVPB 3.375 g       Placed in Followed by Linked Group   3.375 g 100 mL/hr over 30 Minutes Intravenous  Once 04/09/24 1201 04/09/24 1302   04/09/24 1115  piperacillin-tazobactam (ZOSYN) IVPB 3.375 g  Status:  Discontinued       Placed in Followed by Linked Group   3.375 g 100 mL/hr over 30 Minutes Intravenous  Once 04/09/24 1109 04/09/24 1123        Assessment/Plan Perforated Diverticulitis - Agree with consult note that original CT appears to be diverticulitis of the sigmoid colon with localized perforation with concern for small abscess and distal free air. Suspect small bowel changes on CT are reactive.  - Repeat CT 11/12 w/. Small pneumoperitoneum, relatively similar volume as the prior CT, thickened appearance of several small bowel loops in the left hemiabdomen with infiltration of the associated mesentery suspicious for enteritis and overall slight interval progression of the  inflammatory process since the prior CT; Mild distal colonic diverticulosis, and reactive ileus like changes.  - Afebrile. No tachycardia or hypotension. WBC downtrending at 12.5 (18.3). Pain improved and less ttp on exam. No peritonitis.  - Will trial CLD. If she does well with this, will slowly advance diet over the next few days. If she has worsening pain with CLD, would recommend diagnostic laparoscopy.  - Cont abx - AM labs - If patient improves with conservative therapies would recommend colonoscopy in ~6-8 weeks.     FEN - CLD. IVF per [facility].  VTE - SCDs, Lovenox ID - Zosyn   - Per [facility] -  HFrEF Primary idiopathic dilated cardiomyopathy Hypertension  Hyperlipidemia  Prediabetes  Incidental findings - Left ovarian mass. Aortic Atherosclerosis   I reviewed nursing notes, hospitalist notes, last 24 h vitals and pain scores, last 48 h intake and output, last 24 h labs and trends, and last 24 h imaging results.    LOS: 4 days    Ozell CHRISTELLA Shaper, [facility] Surgery 04/13/2024, 10:40 AM Please see Amion for pager number during day hours 7:00am-4:30pm   I personally saw the patient and performed  a substantive portion of the medical decision making, in conjunction with the Advanced Practice provider Michael Maczis, PA-C for the treatment of  enteritis.  I personally reviewed images of ct scan showing enteritis. I personally discussed this patient's care with Dr Fairy about diet. I reviewed recent lab values, vitals, and notes.  She does appear on most recent ct to have enteritis not diveriticulitis she is much better clinically. Will do liquids today. If she does well can advance as tolerated. If not there is some role for laparoscopy but that doesn't appear to be needed now

## 2024-04-13 NOTE — Progress Notes (Signed)
 IV zosyn started at 1137 at approximately 1320 pump stopped due to leaking. IV team consult placed. New IV placed at 1506. Spoke to [employer], pharmacy, he stated to skip remainder of dose and start new bag at scheduled time of 1800.

## 2024-04-14 ENCOUNTER — Telehealth (HOSPITAL_COMMUNITY): Payer: Self-pay

## 2024-04-14 ENCOUNTER — Other Ambulatory Visit (HOSPITAL_COMMUNITY): Payer: Self-pay

## 2024-04-14 DIAGNOSIS — R198 Other specified symptoms and signs involving the digestive system and abdomen: Secondary | ICD-10-CM | POA: Diagnosis not present

## 2024-04-14 LAB — BASIC METABOLIC PANEL WITH GFR
Anion gap: 9 (ref 5–15)
BUN: 26 mg/dL — ABNORMAL HIGH (ref 8–23)
CO2: 20 mmol/L — ABNORMAL LOW (ref 22–32)
Calcium: 8.7 mg/dL — ABNORMAL LOW (ref 8.9–10.3)
Chloride: 112 mmol/L — ABNORMAL HIGH (ref 98–111)
Creatinine, Ser: 0.98 mg/dL (ref 0.44–1.00)
GFR, Estimated: >60 mL/min (ref >60)
Glucose, Bld: 173 mg/dL — ABNORMAL HIGH (ref 70–99)
Potassium: 3.8 mmol/L (ref 3.5–5.1)
Sodium: 141 mmol/L (ref 135–145)

## 2024-04-14 LAB — CULTURE, BLOOD (ROUTINE X 2)
Culture: NO GROWTH
Culture: NO GROWTH

## 2024-04-14 LAB — CBC
HCT: 35.5 % — ABNORMAL LOW (ref 36.0–46.0)
Hemoglobin: 11.4 g/dL — ABNORMAL LOW (ref 12.0–15.0)
MCH: 28.4 pg (ref 26.0–34.0)
MCHC: 32.1 g/dL (ref 30.0–36.0)
MCV: 88.5 fL (ref 80.0–100.0)
Platelets: 293 K/uL (ref 150–400)
RBC: 4.01 MIL/uL (ref 3.87–5.11)
RDW: 14.6 % (ref 11.5–15.5)
WBC: 10.5 K/uL (ref 4.0–10.5)
nRBC: 0 % (ref 0.0–0.2)

## 2024-04-14 MED ORDER — GLYCERIN (LAXATIVE) 2 G RE SUPP
1.0000 | Freq: Every day | RECTAL | Status: DC | PRN
  Administered 2024-04-14: 1 via RECTAL
  Filled 2024-04-14 (×2): qty 1

## 2024-04-14 MED ORDER — DOCUSATE SODIUM 100 MG PO CAPS
100.0000 mg | ORAL_CAPSULE | Freq: Two times a day (BID) | ORAL | Status: DC
  Administered 2024-04-14 – 2024-04-19 (×8): 100 mg via ORAL
  Filled 2024-04-14 (×11): qty 1

## 2024-04-14 MED ORDER — POLYETHYLENE GLYCOL 3350 17 G PO PACK
17.0000 g | PACK | Freq: Two times a day (BID) | ORAL | Status: DC
  Administered 2024-04-14 – 2024-04-19 (×4): 17 g via ORAL
  Filled 2024-04-14 (×10): qty 1

## 2024-04-14 NOTE — Progress Notes (Signed)
 Subjective: Still having some LLQ pain but no BM in several days. Passing some flatus. Denies nausea or vomiting.  Afebrile and WBC normalized.  Daughter in law on speaker phone while I was present.   Objective: Vital signs in last 24 hours: Temp:  [97.3 F (36.3 C)-98 F (36.7 C)] 97.3 F (36.3 C) (11/14 0800) Pulse Rate:  [64-80] 66 (11/14 0800) Resp:  [16-18] 16 (11/14 0800) BP: (123-156)/(66-82) 123/66 (11/14 0800) SpO2:  [92 %-95 %] 94 % (11/14 0800) Last BM Date : 04/11/24  Intake/Output from previous day: No intake/output data recorded. Intake/Output this shift: No intake/output data recorded.  PE: Gen:  Alert, NAD, pleasant Abd: Soft, ND, mild LLQ ttp without peritonitis or guarding   Lab Results:  Recent Labs    04/13/24 0412 04/14/24 0316  WBC 12.5* 10.5  HGB 11.6* 11.4*  HCT 36.3 35.5*  PLT 292 293   BMET Recent Labs    04/13/24 0412 04/14/24 0316  NA 145 141  K 3.8 3.8  CL 114* 112*  CO2 21* 20*  GLUCOSE 93 173*  BUN 25* 26*  CREATININE 1.04* 0.98  CALCIUM  8.6* 8.7*   PT/INR No results for input(s): LABPROT, INR in the last 72 hours. CMP     Component Value Date/Time   NA 141 04/14/2024 0316   NA 143 04/24/2020 0945   K 3.8 04/14/2024 0316   CL 112 (H) 04/14/2024 0316   CO2 20 (L) 04/14/2024 0316   GLUCOSE 173 (H) 04/14/2024 0316   BUN 26 (H) 04/14/2024 0316   BUN 15 04/24/2020 0945   CREATININE 0.98 04/14/2024 0316   CALCIUM  8.7 (L) 04/14/2024 0316   PROT 5.8 (L) 04/12/2024 0445   PROT 6.7 04/24/2020 0945   ALBUMIN 2.3 (L) 04/12/2024 0445   ALBUMIN 4.1 04/24/2020 0945   AST 15 04/12/2024 0445   ALT 18 04/12/2024 0445   ALKPHOS 60 04/12/2024 0445   BILITOT 1.4 (H) 04/12/2024 0445   BILITOT 0.5 04/24/2020 0945   GFRNONAA >60 04/14/2024 0316   GFRAA 95 04/24/2020 0945   Lipase     Component Value Date/Time   LIPASE <10 (L) 04/09/2024 0938    Studies/Results: CT ABDOMEN PELVIS WO CONTRAST Result Date:  04/12/2024 CLINICAL DATA:  Diverticulitis complication suspected. EXAM: CT ABDOMEN AND PELVIS WITHOUT CONTRAST TECHNIQUE: Multidetector CT imaging of the abdomen and pelvis was performed following the standard protocol without IV contrast. RADIATION DOSE REDUCTION: This exam was performed according to the departmental dose-optimization program which includes automated exposure control, adjustment of the mA and/or kV according to patient size and/or use of iterative reconstruction technique. COMPARISON:  CT abdomen pelvis dated 04/09/2024. FINDINGS: Evaluation of this exam is limited in the absence of intravenous contrast. Lower chest: Trace bilateral pleural effusions. Small pneumoperitoneum, relatively similar volume as the prior CT. There is small free fluid in the pelvis. Hepatobiliary: The liver is unremarkable. No biliary dilatation. The gallbladder is unremarkable. Pancreas: Unremarkable. No pancreatic ductal dilatation or surrounding inflammatory changes. Spleen: Normal in size without focal abnormality. Adrenals/Urinary Tract: The adrenal glands unremarkable. There is no hydronephrosis or nephrolithiasis on either side. The visualized ureters and urinary bladder appear unremarkable. Stomach/Bowel: Mild distal colonic diverticulosis. Mildly dilated small bowel in the anterior abdomen measures 3.4 cm in caliber, likely a reactive ileus. There is thickened appearance of several small bowel loops in the left hemiabdomen with infiltration of the associated mesentery suspicious for enteritis. Overall slight interval progression of the inflammatory  process since the prior CT. The appendix is normal. Vascular/Lymphatic: Mild atherosclerotic calcification of the iliac arteries. The abdominal aorta and IVC are grossly unremarkable. No portal venous gas. There is no adenopathy. Reproductive: Hysterectomy. Other: None Musculoskeletal: No acute or significant osseous findings. IMPRESSION: 1. Small pneumoperitoneum,  relatively similar volume as the prior CT. 2. Findings suspicious for enteritis in the left hemiabdomen, slightly progressed since the prior CT. 3. Mildly dilated small bowel in the anterior abdomen, likely a reactive ileus. 4. Trace bilateral pleural effusions. Electronically Signed   By: Vanetta Chou M.D.   On: 04/12/2024 14:29    Anti-infectives: Anti-infectives (From admission, onward)    Start     Dose/Rate Route Frequency Ordered Stop   04/13/24 1800  piperacillin-tazobactam (ZOSYN) IVPB 3.375 g        3.375 g 12.5 mL/hr over 240 Minutes Intravenous Every 8 hours 04/13/24 1502     04/09/24 1800  piperacillin-tazobactam (ZOSYN) IVPB 3.375 g  Status:  Discontinued       Placed in Followed by Linked Group   3.375 g 12.5 mL/hr over 240 Minutes Intravenous Every 8 hours 04/09/24 1109 04/09/24 1123   04/09/24 1800  piperacillin-tazobactam (ZOSYN) IVPB 3.375 g  Status:  Discontinued       Placed in Followed by Linked Group   3.375 g 12.5 mL/hr over 240 Minutes Intravenous Every 8 hours 04/09/24 1201 04/13/24 1502   04/09/24 1215  piperacillin-tazobactam (ZOSYN) IVPB 3.375 g  Status:  Discontinued        3.375 g 100 mL/hr over 30 Minutes Intravenous  Once 04/09/24 1200 04/09/24 1201   04/09/24 1215  piperacillin-tazobactam (ZOSYN) IVPB 3.375 g       Placed in Followed by Linked Group   3.375 g 100 mL/hr over 30 Minutes Intravenous  Once 04/09/24 1201 04/09/24 1302   04/09/24 1115  piperacillin-tazobactam (ZOSYN) IVPB 3.375 g  Status:  Discontinued       Placed in Followed by Linked Group   3.375 g 100 mL/hr over 30 Minutes Intravenous  Once 04/09/24 1109 04/09/24 1123        Assessment/Plan Perforated Diverticulitis vs enteritis  - Agree with consult note that original CT appears to be diverticulitis of the sigmoid colon with localized perforation with concern for small abscess and distal free air. Suspect small bowel changes on CT are reactive.  - Repeat CT 11/12 w/.  Small pneumoperitoneum, relatively similar volume as the prior CT, thickened appearance of several small bowel loops in the left hemiabdomen with infiltration of the associated mesentery suspicious for enteritis and overall slight interval progression of the inflammatory process since the prior CT; Mild distal colonic diverticulosis, and reactive ileus like changes.  - Afebrile. No tachycardia or hypotension. WBC normalized today at 10.5 (18.3). Pain improved and less ttp on exam. No peritonitis.  - Continue CLD today and add bowel regimen. If she has worsening pain or leukocytosis, would recommend diagnostic laparoscopy vs repeat CT.  - Cont abx - AM labs - If patient improves with conservative therapies would recommend colonoscopy in ~6-8 weeks.     FEN - CLD. IVF per [facility].  VTE - SCDs, Lovenox ID - Zosyn   - Per [facility] -  HFrEF Primary idiopathic dilated cardiomyopathy Hypertension  Hyperlipidemia  Prediabetes  Incidental findings - Left ovarian mass. Aortic Atherosclerosis   I reviewed nursing notes, hospitalist notes, last 24 h vitals and pain scores, last 48 h intake and output, last 24 h labs and trends,  and last 24 h imaging results.    LOS: 5 days    Burnard JONELLE Louder, [facility] Surgery 04/14/2024, 10:16 AM Please see Amion for pager number during day hours 7:00am-4:30pm

## 2024-04-14 NOTE — Progress Notes (Signed)
 Occupational Therapy Treatment Patient Details Name: Summer Watkins MRN: [id number] DOB: 1950-10-18 Today's Date: 04/14/2024   History of present illness Summer Watkins is a 74 y.o. female with medical history significant of HFrEF, hypertension, hyperlipidemia, prediabetes, presents to [facility] ED complaining of significant left lower quadrant abdominal pain, nausea/vomiting, diarrhea for 1 day, which started on 04/08/2024.  Patient describes abdominal pain as sharp, sudden onset, no radiation.  Reported associated nausea with vomiting recently ingested food, denies seeing any blood.  Reported 1 episode of loose stool.  Denies ever having similar symptoms in the past.  Patient denies any chest pain, shortness of breath, fever/chills, dizziness, diaphoresis.  In [facility] ED, initial vital signs otherwise stable, currently now with some tachycardia.  Labs showed WBC 17.6, noted lactic acidosis 2.1-2.6, otherwise unremarkable.  CT abdomen/pelvis showed possible left lower quadrant bowel wall thickening with some small amount of pneumoperitoneum with findings concerning for perforated viscus likely 2/2 small bowel enteritis.  Patient was given IV morphine, IV fluid and started on IV Zosyn.  Patient was made n.p.o. and an NG tube was placed.   OT comments  Pt. Seen for skilled OT treatment session with focus on use of A/E for LB ADLs.  Pt. Able to complete bed mobility with S.  Seated eob MIN A for use of reacher and sock aide for LB dressing.  Reviewed other A/E options and provided handout for her and her dtr. To review if interest or need for purchase.  Pt. Receptive to all education and eager for cont. Progress and feeling better.  Reports strong family support.  Cont. With acute OT POC.       If plan is discharge home, recommend the following:  A little help with bathing/dressing/bathroom;Assistance with cooking/housework;Assist for transportation;Help with stairs or ramp  for entrance   Equipment Recommendations       Recommendations for Other Services      Precautions / Restrictions Precautions Precautions: None       Mobility Bed Mobility Overal bed mobility: Needs Assistance Bed Mobility: Supine to Sit     Supine to sit: Supervision, HOB elevated, Used rails     General bed mobility comments: pt. with hob elevated able to bring bles off of bed and use bed rail to exit on R side of the bed    Transfers                   General transfer comment: pt. seated eob for duration of session and asked to sit eob after, rn notified and states ok     Balance                                           ADL either performed or assessed with clinical judgement   ADL Overall ADL's : Needs assistance/impaired             Lower Body Bathing: With adaptive equipment;Cueing for compensatory techniques;Sitting/lateral leans Lower Body Bathing Details (indicate cue type and reason): reviewed use of LH sponge, pt. reports she recently ordered an item so she can access her private areas easier during washing up     Lower Body Dressing: With adaptive equipment;Cueing for compensatory techniques;Sitting/lateral leans;Minimal assistance;Cueing for sequencing Lower Body Dressing Details (indicate cue type and reason): pt. able to don/doff socks with use of reacher and sock aide.  General ADL Comments: provided AE handout and reviewed options on how to order if interested    Extremity/Trunk Assessment              Vision       Perception     Praxis     Communication Communication Communication: No apparent difficulties   Cognition Arousal: Alert Behavior During Therapy: WFL for tasks assessed/performed Cognition: No apparent impairments                               Following commands: Intact        Cueing      Exercises      Shoulder Instructions       General  Comments      Pertinent Vitals/ Pain       Pain Assessment Pain Assessment: No/denies pain Pain Intervention(s): Premedicated before session  Home Living                                          Prior Functioning/Environment              Frequency  Min 2X/week        Progress Toward Goals  OT Goals(current goals can now be found in the care plan section)  Progress towards OT goals: Progressing toward goals     Plan      Co-evaluation                 AM-PAC OT 6 Clicks Daily Activity     Outcome Measure   Help from another person eating meals?: None Help from another person taking care of personal grooming?: None Help from another person toileting, which includes using toliet, bedpan, or urinal?: A Little Help from another person bathing (including washing, rinsing, drying)?: A Little Help from another person to put on and taking off regular upper body clothing?: None Help from another person to put on and taking off regular lower body clothing?: A Little 6 Click Score: 21    End of Session Equipment Utilized During Treatment: Other (comment) (A/E)  OT Visit Diagnosis: Pain;Unsteadiness on feet (R26.81)   Activity Tolerance Patient tolerated treatment well   Patient Left in bed;with call bell/phone within reach;with bed alarm set   Nurse Communication Other (comment) (rn states ok to work with pt, pt. pre medicated, pt. asks to sit eob, rn gave permission to)        Time: 8980-8962 OT Time Calculation (min): 18 min  Charges: OT General Charges $OT Visit: 1 Visit OT Treatments $Self Care/Home Management : 8-22 mins  Randall, COTA/L Acute Rehabilitation [phone]   CHRISTELLA Nest Lorraine-COTA/L  04/14/2024, 11:20 AM

## 2024-04-14 NOTE — Telephone Encounter (Signed)
 Pharmacy Patient Advocate Encounter  Insurance verification completed.    The patient is insured through [employer]. Patient has Medicare and is not eligible for a copay card, but may be able to apply for patient assistance or Medicare RX Payment Plan (Patient Must reach out to their plan, if eligible for payment plan), if available.    Ran test claim for Farxiga 10mg  and the current 30 day co-pay is $489.18.  Ran test claim for Jardiance 10mg  and the current 30 day co-pay is $486.28  Ran test claim for Generic Entresto 24-26mg  and the current 30 day co-pay is $75.30.   This test claim was processed through [facility]- copay amounts may vary at other pharmacies due to pharmacy/plan contracts, or as the patient moves through the different stages of their insurance plan.

## 2024-04-14 NOTE — Progress Notes (Signed)
 PROGRESS NOTE    Summer Watkins  FMW:[id number] DOB: Oct 01, 1950 DOA: 04/09/2024 PCP: Evangelina Tinnie Norris, PA-C  73/F with chronic systolic CHF, hypertension, prediabetes presented to [facility] ED with left lower quadrant abdominal pain nausea and vomiting, labs noted leukocytosis, lactic acidosis, CT abdomen pelvis noted left lower quadrant small bowel thickening with small amount of pneumoperitoneum, concerning for perforated viscus likely secondary to diverticulitis.  Made n.p.o., NG tube placed, started on IV Zosyn, general surgery following - Repeat CT reassuring, started clears 11/13  Subjective: - More left lower quadrant abdominal pain yesterday, started clears, some flatus, no nausea or vomiting  Assessment and Plan:  Acute diverticulitis with localized perforation and small abscess Sepsis, POA - Remains on IV Zosyn, now off IV fluids -CCS following, repeat CT largely stable, started to have more pain yesterday -Now WBC count has improved, mild tenderness on exam - Continue clears, await surgical eval - Will need repeat colonoscopy in near future  Chronic systolic CHF Primary idiopathic dilated cardiomyopathy - Last echo at outside hospital in 8/25 noted EF of 30-35% - Heart failure meds including Lasix Entresto Coreg Aldactone held on admission - Started Entresto Aldactone and Coreg  Hypertension - Meds as above  Incidental left ovarian mass - Needs further GYN workup as outpatient  Prediabetes A1c is 5.7  Class II obesity BMI 36.5  Mild AKI, prerenal Resolved   DVT prophylaxis:  Lovenox Code Status: Full code Family Communication: No family at bedside, updated niece on telephone from patient's room yesterday Disposition Plan: Home pending improvement in above  Consultants:    Procedures:   Antimicrobials:    Objective: Vitals:   04/13/24 1927 04/13/24 2327 04/14/24 0425 04/14/24 0800  BP: (!) 156/81 (!) 144/72 (!) 147/82 123/66   Pulse: 66 67 80 66  Resp: 18 18 18 16   Temp: 97.9 F (36.6 C) 98 F (36.7 C) 98 F (36.7 C) (!) 97.3 F (36.3 C)  TempSrc: Oral Oral Oral Oral  SpO2: 95% 95% 92% 94%  Weight:      Height:       No intake or output data in the 24 hours ending 04/14/24 1148  Filed Weights   04/09/24 0918 04/09/24 1520  Weight: 83.9 kg 90.5 kg    Examination:  General exam: Appears calm and comfortable, AO x 3, no distress HEENT: Neck obese unable to assess JVD Respiratory system: Decreased breath sounds at the bases Cardiovascular system: S1 & S2 heard, RRR.  Abd: Soft, mildly distended, mild left lower quadrant tenderness, bowel sounds decreased Central nervous system: Alert and oriented. No focal neurological deficits. Extremities: no edema Skin: No rashes Psychiatry:  Mood & affect appropriate.     Data Reviewed:   CBC: Recent Labs  Lab 04/10/24 0459 04/11/24 0433 04/12/24 0445 04/13/24 0412 04/14/24 0316  WBC 22.3* 19.7* 18.3* 12.5* 10.5  NEUTROABS  --  17.6* 16.5*  --   --   HGB 13.0 10.4* 11.4* 11.6* 11.4*  HCT 40.3 32.5* 35.2* 36.3 35.5*  MCV 87.6 88.1 89.3 88.5 88.5  PLT 270 255 262 292 293   Basic Metabolic Panel: Recent Labs  Lab 04/10/24 0459 04/11/24 0433 04/12/24 0445 04/13/24 0412 04/14/24 0316  NA 139 142 145 145 141  K 4.2 3.9 3.7 3.8 3.8  CL 104 104 110 114* 112*  CO2 25 24 20* 21* 20*  GLUCOSE 136* 105* 106* 93 173*  BUN 26* 30* 28* 25* 26*  CREATININE 1.13* 1.21* 1.03* 1.04* 0.98  CALCIUM  9.5 9.0 8.6* 8.6* 8.7*   GFR: Estimated Creatinine Clearance: 54.3 mL/min (by C-G formula based on SCr of 0.98 mg/dL). Liver Function Tests: Recent Labs  Lab 04/09/24 0938 04/10/24 0459 04/11/24 0433 04/12/24 0445  AST 18 14* 14* 15  ALT 24 21 18 18   ALKPHOS 65 66 60 60  BILITOT 0.9 1.0 1.2 1.4*  PROT 6.5 6.5 5.8* 5.8*  ALBUMIN 3.9 3.0* 2.4* 2.3*   Recent Labs  Lab 04/09/24 0938  LIPASE <10*   No results for input(s): AMMONIA in the last 168  hours. Coagulation Profile: No results for input(s): INR, PROTIME in the last 168 hours. Cardiac Enzymes: No results for input(s): CKTOTAL, CKMB, CKMBINDEX, TROPONINI in the last 168 hours. BNP (last 3 results) No results for input(s): PROBNP in the last 8760 hours. HbA1C: No results for input(s): HGBA1C in the last 72 hours.  CBG: No results for input(s): GLUCAP in the last 168 hours. Lipid Profile: No results for input(s): CHOL, HDL, LDLCALC, TRIG, CHOLHDL, LDLDIRECT in the last 72 hours.  Thyroid Function Tests: No results for input(s): TSH, T4TOTAL, FREET4, T3FREE, THYROIDAB in the last 72 hours. Anemia Panel: No results for input(s): VITAMINB12, FOLATE, FERRITIN, TIBC, IRON, RETICCTPCT in the last 72 hours. Urine analysis:    Component Value Date/Time   COLORURINE YELLOW 04/09/2024 2000   APPEARANCEUR HAZY (A) 04/09/2024 2000   LABSPEC >1.046 (H) 04/09/2024 2000   PHURINE 5.0 04/09/2024 2000   GLUCOSEU NEGATIVE 04/09/2024 2000   HGBUR NEGATIVE 04/09/2024 2000   BILIRUBINUR NEGATIVE 04/09/2024 2000   BILIRUBINUR Negative 04/24/2020 0938   KETONESUR NEGATIVE 04/09/2024 2000   PROTEINUR NEGATIVE 04/09/2024 2000   UROBILINOGEN 1.0 04/24/2020 0938   NITRITE NEGATIVE 04/09/2024 2000   LEUKOCYTESUR NEGATIVE 04/09/2024 2000   Sepsis Labs: @LABRCNTIP (procalcitonin:4,lacticidven:4)  ) Recent Results (from the past 240 hours)  Culture, blood (Routine X 2) w Reflex to ID Panel     Status: None   Collection Time: 04/09/24  4:53 PM   Specimen: BLOOD  Result Value Ref Range Status   Specimen Description BLOOD SITE NOT SPECIFIED  Final   Special Requests   Final    BOTTLES DRAWN AEROBIC AND ANAEROBIC Blood Culture results may not be optimal due to an inadequate volume of blood received in culture bottles   Culture   Final    NO GROWTH 5 DAYS Performed at [facility], [address], [place], [place] [address]     Report Status 04/14/2024 FINAL  Final  Culture, blood (Routine X 2) w Reflex to ID Panel     Status: None   Collection Time: 04/09/24  4:54 PM   Specimen: BLOOD  Result Value Ref Range Status   Specimen Description BLOOD SITE NOT SPECIFIED  Final   Special Requests   Final    BOTTLES DRAWN AEROBIC ONLY Blood Culture results may not be optimal due to an inadequate volume of blood received in culture bottles   Culture   Final    NO GROWTH 5 DAYS Performed at [facility], [address], [place], [place] [address]    Report Status 04/14/2024 FINAL  Final     Radiology Studies: No results found.    Scheduled Meds:  carvedilol  6.25 mg Oral BID WC   docusate sodium  100 mg Oral BID   enoxaparin (LOVENOX) injection  40 mg Subcutaneous Q24H   polyethylene glycol  17 g Oral BID   sacubitril-valsartan  1 tablet Oral  BID   spironolactone  12.5 mg Oral Daily   Continuous Infusions:  piperacillin-tazobactam (ZOSYN)  IV 3.375 g (04/14/24 0433)     LOS: 5 days    Time spent:    Sigurd Pac, MD [facility]   04/14/2024, 11:48 AM

## 2024-04-15 DIAGNOSIS — R198 Other specified symptoms and signs involving the digestive system and abdomen: Secondary | ICD-10-CM | POA: Diagnosis not present

## 2024-04-15 LAB — CBC
HCT: 34.3 % — ABNORMAL LOW (ref 36.0–46.0)
Hemoglobin: 11 g/dL — ABNORMAL LOW (ref 12.0–15.0)
MCH: 28.4 pg (ref 26.0–34.0)
MCHC: 32.1 g/dL (ref 30.0–36.0)
MCV: 88.6 fL (ref 80.0–100.0)
Platelets: 278 K/uL (ref 150–400)
RBC: 3.87 MIL/uL (ref 3.87–5.11)
RDW: 14.6 % (ref 11.5–15.5)
WBC: 14 K/uL — ABNORMAL HIGH (ref 4.0–10.5)
nRBC: 0 % (ref 0.0–0.2)

## 2024-04-15 LAB — BASIC METABOLIC PANEL WITH GFR
Anion gap: 12 (ref 5–15)
BUN: 19 mg/dL (ref 8–23)
CO2: 22 mmol/L (ref 22–32)
Calcium: 8.6 mg/dL — ABNORMAL LOW (ref 8.9–10.3)
Chloride: 108 mmol/L (ref 98–111)
Creatinine, Ser: 0.75 mg/dL (ref 0.44–1.00)
GFR, Estimated: >60 mL/min (ref >60)
Glucose, Bld: 155 mg/dL — ABNORMAL HIGH (ref 70–99)
Potassium: 3.7 mmol/L (ref 3.5–5.1)
Sodium: 142 mmol/L (ref 135–145)

## 2024-04-15 NOTE — Progress Notes (Signed)
 Assessment & Plan: Perforated diverticulitis vs enteritis  - Agree with consult note that original CT appears to be diverticulitis of the sigmoid colon with localized perforation with concern for small abscess and distal free air. Suspect small bowel changes on CT are reactive.  - Repeat CT 11/12 w/. Small pneumoperitoneum, relatively similar volume as the prior CT, thickened appearance of several small bowel loops in the left hemiabdomen with infiltration of the associated mesentery suspicious for enteritis and overall slight interval progression of the inflammatory process since the prior CT; Mild distal colonic diverticulosis, and reactive ileus. - Afebrile. No tachycardia or hypotension. No sweats or chills. - WBC increased to 14K this AM - Continue CLD today and add bowel regimen - repeat CBC in AM 11/16 - if continued increase in WBC, will repeat CT scan  - Cont IV abx - If patient improves with conservative therapies would recommend colonoscopy in ~6-8 weeks.    Discussed above with patient and daughter at bedside.   FEN - CLD. IVF per TRH.  VTE - SCDs, Lovenox ID - Zosyn   - Per TRH -  HFrEF Primary idiopathic dilated cardiomyopathy Hypertension  Hyperlipidemia  Prediabetes  Incidental findings - Left ovarian mass. Aortic Atherosclerosis           Krystal Spinner, MD [facility] practice Office: [phone]        Chief Complaint: Perforated diverticulitis  Subjective: Patient in bed, had large BM this AM.  Abd pain improved - mostly LLQ.  Denies fever, sweats.  Tolerating clear liquids.  Ambulatory.  Objective: Vital signs in last 24 hours: Temp:  [97.4 F (36.3 C)-98.1 F (36.7 C)] 98.1 F (36.7 C) (11/15 0818) Pulse Rate:  [66-84] 80 (11/15 0818) Resp:  [16-18] 17 (11/15 0818) BP: (113-135)/(59-83) 135/83 (11/15 0818) SpO2:  [93 %-96 %] 95 % (11/15 0818) Last BM Date : 04/15/24  Intake/Output from previous day: 11/14 0701 -  11/15 0700 In: 1273.3 [P.O.:1117; IV Piggyback:156.3] Out: -  Intake/Output this shift: Total I/O In: 300 [P.O.:300] Out: -   Physical Exam: HEENT - sclerae clear, mucous membranes moist Abdomen - soft, obese; mild to moderate tenderness LLQ; no guarding; no mass  Lab Results:  Recent Labs    04/14/24 0316 04/15/24 0406  WBC 10.5 14.0*  HGB 11.4* 11.0*  HCT 35.5* 34.3*  PLT 293 278   BMET Recent Labs    04/14/24 0316 04/15/24 0406  NA 141 142  K 3.8 3.7  CL 112* 108  CO2 20* 22  GLUCOSE 173* 155*  BUN 26* 19  CREATININE 0.98 0.75  CALCIUM  8.7* 8.6*   PT/INR No results for input(s): LABPROT, INR in the last 72 hours. Comprehensive Metabolic Panel:    Component Value Date/Time   NA 142 04/15/2024 0406   NA 141 04/14/2024 0316   NA 143 04/24/2020 0945   NA 140 10/19/2019 0925   K 3.7 04/15/2024 0406   K 3.8 04/14/2024 0316   CL 108 04/15/2024 0406   CL 112 (H) 04/14/2024 0316   CO2 22 04/15/2024 0406   CO2 20 (L) 04/14/2024 0316   BUN 19 04/15/2024 0406   BUN 26 (H) 04/14/2024 0316   BUN 15 04/24/2020 0945   BUN 14 10/19/2019 0925   CREATININE 0.75 04/15/2024 0406   CREATININE 0.98 04/14/2024 0316   GLUCOSE 155 (H) 04/15/2024 0406   GLUCOSE 173 (H) 04/14/2024 0316   CALCIUM  8.6 (L) 04/15/2024 0406   CALCIUM  8.7 (  L) 04/14/2024 0316   AST 15 04/12/2024 0445   AST 14 (L) 04/11/2024 0433   ALT 18 04/12/2024 0445   ALT 18 04/11/2024 0433   ALKPHOS 60 04/12/2024 0445   ALKPHOS 60 04/11/2024 0433   BILITOT 1.4 (H) 04/12/2024 0445   BILITOT 1.2 04/11/2024 0433   BILITOT 0.5 04/24/2020 0945   BILITOT 0.5 10/19/2019 0925   PROT 5.8 (L) 04/12/2024 0445   PROT 5.8 (L) 04/11/2024 0433   PROT 6.7 04/24/2020 0945   PROT 6.6 10/19/2019 0925   ALBUMIN 2.3 (L) 04/12/2024 0445   ALBUMIN 2.4 (L) 04/11/2024 0433   ALBUMIN 4.1 04/24/2020 0945   ALBUMIN 4.1 10/19/2019 0925    Studies/Results: No results found.    Krystal Spinner 04/15/2024  Patient ID:  Summer Watkins, female   DOB: 02/19/1951, 73 y.o.   MRN: [id number]

## 2024-04-15 NOTE — Progress Notes (Addendum)
 PROGRESS NOTE    Summer Watkins  FMW:[id number] DOB: 02-Oct-1950 DOA: 04/09/2024 PCP: Evangelina Tinnie Norris, PA-C  73/F with chronic systolic CHF, hypertension, prediabetes presented to [facility] ED with left lower quadrant abdominal pain nausea and vomiting, labs noted leukocytosis, lactic acidosis, CT abdomen pelvis noted left lower quadrant small bowel thickening with small amount of pneumoperitoneum, concerning for perforated viscus likely secondary to diverticulitis.  Made n.p.o., NG tube placed, started on IV Zosyn, general surgery following - Repeat CT reassuring, started clears 11/13  Subjective: - Mild left lower quadrant discomfort, had a BM yesterday  Assessment and Plan:  Acute diverticulitis with localized perforation and small abscess Sepsis, POA -CCS following, repeat CT largely stable, - Mild worsening leukocytosis today -Continue IV Zosyn, now off IV fluids - Continue clears - Will need repeat colonoscopy in near future  Chronic systolic CHF Primary idiopathic dilated cardiomyopathy - Last echo at outside hospital in 8/25 noted EF of 30-35% - Heart failure meds including Lasix Entresto Coreg Aldactone held on admission - restarted Entresto Aldactone and Coreg, resume Lasix in 1 to 2 days  Hypertension - Meds as above  Incidental left ovarian mass - Needs further GYN workup as outpatient  Prediabetes A1c is 5.7  Class II obesity BMI 36.5  Mild AKI, prerenal Resolved   DVT prophylaxis:  Lovenox Code Status: Full code Family Communication: No family at bedside today Disposition Plan: Home pending improvement in above  Consultants:    Procedures:   Antimicrobials:    Objective: Vitals:   04/14/24 1915 04/14/24 2312 04/15/24 0547 04/15/24 0818  BP: 118/80 113/63 (!) 130/59 135/83  Pulse: 84 70 79 80  Resp: 18 18 18 17   Temp: 97.8 F (36.6 C) (!) 97.4 F (36.3 C) 98.1 F (36.7 C) 98.1 F (36.7 C)  TempSrc: Oral Oral Oral  Oral  SpO2: 96% 96% 96% 95%  Weight:      Height:        Intake/Output Summary (Last 24 hours) at 04/15/2024 1101 Last data filed at 04/15/2024 0900 Gross per 24 hour  Intake 1136.25 ml  Output --  Net 1136.25 ml    Filed Weights   04/09/24 0918 04/09/24 1520  Weight: 83.9 kg 90.5 kg    Examination:  General exam: Appears calm and comfortable, AO x 3, no distress HEENT: Neck obese unable to assess JVD Respiratory system: Decreased breath sounds at the bases Cardiovascular system: S1 & S2 heard, RRR.  Abd: Soft, less distended, mild left lower quadrant tenderness, bowel sounds present Central nervous system: Alert and oriented. No focal neurological deficits. Extremities: no edema Skin: No rashes Psychiatry:  Mood & affect appropriate.     Data Reviewed:   CBC: Recent Labs  Lab 04/11/24 0433 04/12/24 0445 04/13/24 0412 04/14/24 0316 04/15/24 0406  WBC 19.7* 18.3* 12.5* 10.5 14.0*  NEUTROABS 17.6* 16.5*  --   --   --   HGB 10.4* 11.4* 11.6* 11.4* 11.0*  HCT 32.5* 35.2* 36.3 35.5* 34.3*  MCV 88.1 89.3 88.5 88.5 88.6  PLT 255 262 292 293 278   Basic Metabolic Panel: Recent Labs  Lab 04/11/24 0433 04/12/24 0445 04/13/24 0412 04/14/24 0316 04/15/24 0406  NA 142 145 145 141 142  K 3.9 3.7 3.8 3.8 3.7  CL 104 110 114* 112* 108  CO2 24 20* 21* 20* 22  GLUCOSE 105* 106* 93 173* 155*  BUN 30* 28* 25* 26* 19  CREATININE 1.21* 1.03* 1.04* 0.98 0.75  CALCIUM  9.0  8.6* 8.6* 8.7* 8.6*   GFR: Estimated Creatinine Clearance: 66.5 mL/min (by C-G formula based on SCr of 0.75 mg/dL). Liver Function Tests: Recent Labs  Lab 04/09/24 0938 04/10/24 0459 04/11/24 0433 04/12/24 0445  AST 18 14* 14* 15  ALT 24 21 18 18   ALKPHOS 65 66 60 60  BILITOT 0.9 1.0 1.2 1.4*  PROT 6.5 6.5 5.8* 5.8*  ALBUMIN 3.9 3.0* 2.4* 2.3*   Recent Labs  Lab 04/09/24 0938  LIPASE <10*   No results for input(s): AMMONIA in the last 168 hours. Coagulation Profile: No results for  input(s): INR, PROTIME in the last 168 hours. Cardiac Enzymes: No results for input(s): CKTOTAL, CKMB, CKMBINDEX, TROPONINI in the last 168 hours. BNP (last 3 results) No results for input(s): PROBNP in the last 8760 hours. HbA1C: No results for input(s): HGBA1C in the last 72 hours.  CBG: No results for input(s): GLUCAP in the last 168 hours. Lipid Profile: No results for input(s): CHOL, HDL, LDLCALC, TRIG, CHOLHDL, LDLDIRECT in the last 72 hours.  Thyroid Function Tests: No results for input(s): TSH, T4TOTAL, FREET4, T3FREE, THYROIDAB in the last 72 hours. Anemia Panel: No results for input(s): VITAMINB12, FOLATE, FERRITIN, TIBC, IRON, RETICCTPCT in the last 72 hours. Urine analysis:    Component Value Date/Time   COLORURINE YELLOW 04/09/2024 2000   APPEARANCEUR HAZY (A) 04/09/2024 2000   LABSPEC >1.046 (H) 04/09/2024 2000   PHURINE 5.0 04/09/2024 2000   GLUCOSEU NEGATIVE 04/09/2024 2000   HGBUR NEGATIVE 04/09/2024 2000   BILIRUBINUR NEGATIVE 04/09/2024 2000   BILIRUBINUR Negative 04/24/2020 0938   KETONESUR NEGATIVE 04/09/2024 2000   PROTEINUR NEGATIVE 04/09/2024 2000   UROBILINOGEN 1.0 04/24/2020 0938   NITRITE NEGATIVE 04/09/2024 2000   LEUKOCYTESUR NEGATIVE 04/09/2024 2000   Sepsis Labs: @LABRCNTIP (procalcitonin:4,lacticidven:4)  ) Recent Results (from the past 240 hours)  Culture, blood (Routine X 2) w Reflex to ID Panel     Status: None   Collection Time: 04/09/24  4:53 PM   Specimen: BLOOD  Result Value Ref Range Status   Specimen Description BLOOD SITE NOT SPECIFIED  Final   Special Requests   Final    BOTTLES DRAWN AEROBIC AND ANAEROBIC Blood Culture results may not be optimal due to an inadequate volume of blood received in culture bottles   Culture   Final    NO GROWTH 5 DAYS Performed at [facility], [address] [place], [place] [address]    Report Status 04/14/2024 FINAL  Final  Culture,  blood (Routine X 2) w Reflex to ID Panel     Status: None   Collection Time: 04/09/24  4:54 PM   Specimen: BLOOD  Result Value Ref Range Status   Specimen Description BLOOD SITE NOT SPECIFIED  Final   Special Requests   Final    BOTTLES DRAWN AEROBIC ONLY Blood Culture results may not be optimal due to an inadequate volume of blood received in culture bottles   Culture   Final    NO GROWTH 5 DAYS Performed at [facility], [address] [place], [place] [address]    Report Status 04/14/2024 FINAL  Final     Radiology Studies: No results found.    Scheduled Meds:  carvedilol  6.25 mg Oral BID WC   docusate sodium  100 mg Oral BID   enoxaparin (LOVENOX) injection  40 mg Subcutaneous Q24H   polyethylene glycol  17 g Oral BID   sacubitril-valsartan  1 tablet Oral BID  spironolactone  12.5 mg Oral Daily   Continuous Infusions:  piperacillin-tazobactam (ZOSYN)  IV 3.375 g (04/15/24 0555)     LOS: 6 days    Time spent:    Sigurd Pac, MD [facility]   04/15/2024, 11:01 AM

## 2024-04-15 NOTE — Plan of Care (Signed)
   Problem: Education: Goal: Knowledge of General Education information will improve Description: Including pain rating scale, medication(s)/side effects and non-pharmacologic comfort measures Outcome: Progressing   Problem: Clinical Measurements: Goal: Will remain free from infection Outcome: Progressing

## 2024-04-16 DIAGNOSIS — R198 Other specified symptoms and signs involving the digestive system and abdomen: Secondary | ICD-10-CM | POA: Diagnosis not present

## 2024-04-16 LAB — BASIC METABOLIC PANEL WITH GFR
Anion gap: 9 (ref 5–15)
BUN: 14 mg/dL (ref 8–23)
CO2: 24 mmol/L (ref 22–32)
Calcium: 8.2 mg/dL — ABNORMAL LOW (ref 8.9–10.3)
Chloride: 108 mmol/L (ref 98–111)
Creatinine, Ser: 0.78 mg/dL (ref 0.44–1.00)
GFR, Estimated: >60 mL/min (ref >60)
Glucose, Bld: 139 mg/dL — ABNORMAL HIGH (ref 70–99)
Potassium: 3.5 mmol/L (ref 3.5–5.1)
Sodium: 141 mmol/L (ref 135–145)

## 2024-04-16 LAB — CBC
HCT: 30.5 % — ABNORMAL LOW (ref 36.0–46.0)
Hemoglobin: 10 g/dL — ABNORMAL LOW (ref 12.0–15.0)
MCH: 28.6 pg (ref 26.0–34.0)
MCHC: 32.8 g/dL (ref 30.0–36.0)
MCV: 87.1 fL (ref 80.0–100.0)
Platelets: 245 K/uL (ref 150–400)
RBC: 3.5 MIL/uL — ABNORMAL LOW (ref 3.87–5.11)
RDW: 14.6 % (ref 11.5–15.5)
WBC: 13.3 K/uL — ABNORMAL HIGH (ref 4.0–10.5)
nRBC: 0 % (ref 0.0–0.2)

## 2024-04-16 MED ORDER — OXYCODONE HCL 5 MG PO TABS: 5.0000 mg | ORAL_TABLET | ORAL | Status: DC | PRN

## 2024-04-16 NOTE — Progress Notes (Signed)
 [facility] Progress Note Patient: Envi Eagleson FMW:[id number] DOB: Jul 07, 1950  DOA: 04/09/2024 DOS: the patient was seen and examined on 04/16/2024  Brief Hospital Course: 73/F with chronic systolic CHF, hypertension, prediabetes presented to [facility] ED with left lower quadrant abdominal pain nausea and vomiting, labs noted leukocytosis, lactic acidosis, CT abdomen pelvis noted left lower quadrant small bowel thickening with small amount of pneumoperitoneum, concerning for perforated viscus likely secondary to diverticulitis.  Made n.p.o., NG tube placed, started on IV Zosyn, general surgery following - Repeat CT reassuring, started clears 11/13 Assessment and Plan: Acute diverticulitis with localized perforation and small abscess Sepsis, POA -CCS following, repeat CT largely stable, -Continue IV Zosyn, now off IV fluids - Continue clears - Will need repeat colonoscopy in near future   Chronic systolic CHF Primary idiopathic dilated cardiomyopathy - Last echo at outside hospital in 8/25 noted EF of 30-35% - Heart failure meds including Lasix Entresto Coreg Aldactone held on admission - restarted Entresto Aldactone and Coreg, resume Lasix in 1 to 2 days   Hypertension - Meds as above   Incidental left ovarian mass - Needs further GYN workup as outpatient   Prediabetes A1c is 5.7   Class II obesity BMI 36.5   Mild AKI, prerenal Resolved  Subjective: Reports ongoing nausea as well as abdominal pain.  No no diarrhea.  No blood in the stool.  Physical Exam: Bowel sounds present.  Diffusely tender. S1-S2 present. Bilateral edema.  Data Reviewed: I have Reviewed nursing notes, Vitals, and Lab results. Since last encounter, pertinent lab results CBC and BMP   . I have ordered test including CBC and BMP  .   Disposition: Status is: Inpatient Remains inpatient appropriate because: needing iv Antibiotics   enoxaparin (LOVENOX) injection 40 mg Start:  04/12/24 1145 SCDs Start: 04/09/24 1618   Family Communication: none at bedside Level of care: Telemetry   Vitals:   04/16/24 0821 04/16/24 1215 04/16/24 1534 04/16/24 1811  BP: 139/69 138/69 124/63 (!) 140/75  Pulse: 73   80  Resp: 18  18   Temp: 98.2 F (36.8 C) 98.6 F (37 C) 98.2 F (36.8 C)   TempSrc: Oral Oral Oral   SpO2:  98% 98%   Weight:      Height:         Author: Yetta Blanch, MD 04/16/2024 7:21 PM  Please look on [email or web address] to find out who is on call.

## 2024-04-16 NOTE — Progress Notes (Signed)
 Assessment & Plan: Perforated diverticulitis vs enteritis  - Afebrile. No tachycardia or hypotension. No sweats or chills. - WBC decreased to 13K this AM - Continue CLD today - repeat CBC in AM 11/17 - cont IV abx - If patient improves with conservative therapies would recommend colonoscopy in ~6-8 weeks.     Discussed above with patient and family at bedside.  Surgery will continue to follow.   FEN - CLD. IVF per TRH.  VTE - SCDs, Lovenox ID - Zosyn   - Per TRH -  HFrEF Primary idiopathic dilated cardiomyopathy Hypertension  Hyperlipidemia  Prediabetes  Incidental findings - Left ovarian mass. Aortic Atherosclerosis.         Krystal Spinner, MD [facility] Surgery [facility] practice Office: [phone]        Chief Complaint: Abdominal pain  Subjective: Patient in bed, family and nurse in room.  Tolerating liquids.  Loose BM's x 3.  Objective: Vital signs in last 24 hours: Temp:  [97.5 F (36.4 C)-98.6 F (37 C)] 98.2 F (36.8 C) (11/16 0821) Pulse Rate:  [63-88] 73 (11/16 0821) Resp:  [16-20] 18 (11/16 0821) BP: (97-139)/(56-85) 139/69 (11/16 0821) SpO2:  [94 %-96 %] 96 % (11/16 0424) Last BM Date : 04/15/24  Intake/Output from previous day: 11/15 0701 - 11/16 0700 In: 777 [P.O.:777] Out: -  Intake/Output this shift: No intake/output data recorded.  Physical Exam: HEENT - sclerae clear, mucous membranes moist Abdomen - mild distension, mild tenderness LLQ without mass or guarding  Lab Results:  Recent Labs    04/15/24 0406 04/16/24 0537  WBC 14.0* 13.3*  HGB 11.0* 10.0*  HCT 34.3* 30.5*  PLT 278 245   BMET Recent Labs    04/15/24 0406 04/16/24 0537  NA 142 141  K 3.7 3.5  CL 108 108  CO2 22 24  GLUCOSE 155* 139*  BUN 19 14  CREATININE 0.75 0.78  CALCIUM  8.6* 8.2*   PT/INR No results for input(s): LABPROT, INR in the last 72 hours. Comprehensive Metabolic Panel:    Component Value Date/Time   NA 141 04/16/2024 0537    NA 142 04/15/2024 0406   NA 143 04/24/2020 0945   NA 140 10/19/2019 0925   K 3.5 04/16/2024 0537   K 3.7 04/15/2024 0406   CL 108 04/16/2024 0537   CL 108 04/15/2024 0406   CO2 24 04/16/2024 0537   CO2 22 04/15/2024 0406   BUN 14 04/16/2024 0537   BUN 19 04/15/2024 0406   BUN 15 04/24/2020 0945   BUN 14 10/19/2019 0925   CREATININE 0.78 04/16/2024 0537   CREATININE 0.75 04/15/2024 0406   GLUCOSE 139 (H) 04/16/2024 0537   GLUCOSE 155 (H) 04/15/2024 0406   CALCIUM  8.2 (L) 04/16/2024 0537   CALCIUM  8.6 (L) 04/15/2024 0406   AST 15 04/12/2024 0445   AST 14 (L) 04/11/2024 0433   ALT 18 04/12/2024 0445   ALT 18 04/11/2024 0433   ALKPHOS 60 04/12/2024 0445   ALKPHOS 60 04/11/2024 0433   BILITOT 1.4 (H) 04/12/2024 0445   BILITOT 1.2 04/11/2024 0433   BILITOT 0.5 04/24/2020 0945   BILITOT 0.5 10/19/2019 0925   PROT 5.8 (L) 04/12/2024 0445   PROT 5.8 (L) 04/11/2024 0433   PROT 6.7 04/24/2020 0945   PROT 6.6 10/19/2019 0925   ALBUMIN 2.3 (L) 04/12/2024 0445   ALBUMIN 2.4 (L) 04/11/2024 0433   ALBUMIN 4.1 04/24/2020 0945   ALBUMIN 4.1 10/19/2019 0925    Studies/Results:  No results found.    Krystal Spinner 04/16/2024  Patient ID: Summer Watkins, female   DOB: June 17, 1950, 73 y.o.   MRN: [id number]

## 2024-04-17 DIAGNOSIS — R198 Other specified symptoms and signs involving the digestive system and abdomen: Secondary | ICD-10-CM | POA: Diagnosis not present

## 2024-04-17 LAB — CBC
HCT: 32.8 % — ABNORMAL LOW (ref 36.0–46.0)
Hemoglobin: 10.6 g/dL — ABNORMAL LOW (ref 12.0–15.0)
MCH: 28.3 pg (ref 26.0–34.0)
MCHC: 32.3 g/dL (ref 30.0–36.0)
MCV: 87.5 fL (ref 80.0–100.0)
Platelets: 276 K/uL (ref 150–400)
RBC: 3.75 MIL/uL — ABNORMAL LOW (ref 3.87–5.11)
RDW: 14.6 % (ref 11.5–15.5)
WBC: 11.6 K/uL — ABNORMAL HIGH (ref 4.0–10.5)
nRBC: 0 % (ref 0.0–0.2)

## 2024-04-17 LAB — BASIC METABOLIC PANEL WITH GFR
Anion gap: 11 (ref 5–15)
BUN: 14 mg/dL (ref 8–23)
CO2: 25 mmol/L (ref 22–32)
Calcium: 8.4 mg/dL — ABNORMAL LOW (ref 8.9–10.3)
Chloride: 103 mmol/L (ref 98–111)
Creatinine, Ser: 0.77 mg/dL (ref 0.44–1.00)
GFR, Estimated: >60 mL/min (ref >60)
Glucose, Bld: 131 mg/dL — ABNORMAL HIGH (ref 70–99)
Potassium: 3.3 mmol/L — ABNORMAL LOW (ref 3.5–5.1)
Sodium: 139 mmol/L (ref 135–145)

## 2024-04-17 MED ORDER — ASPIRIN 81 MG PO TBEC
81.0000 mg | DELAYED_RELEASE_TABLET | Freq: Every day | ORAL | Status: DC
  Administered 2024-04-17 – 2024-04-19 (×3): 81 mg via ORAL
  Filled 2024-04-17 (×3): qty 1

## 2024-04-17 MED ORDER — PIPERACILLIN-TAZOBACTAM 3.375 G IVPB
3.3750 g | Freq: Three times a day (TID) | INTRAVENOUS | Status: DC
  Administered 2024-04-17 – 2024-04-19 (×6): 3.375 g via INTRAVENOUS
  Filled 2024-04-17 (×8): qty 50

## 2024-04-17 MED ORDER — POTASSIUM CHLORIDE CRYS ER 20 MEQ PO TBCR
40.0000 meq | EXTENDED_RELEASE_TABLET | ORAL | Status: AC
  Administered 2024-04-17 (×2): 40 meq via ORAL
  Filled 2024-04-17 (×2): qty 2

## 2024-04-17 MED ORDER — SODIUM CHLORIDE 0.9 % IV SOLN: 3.0000 g | Freq: Four times a day (QID) | INTRAVENOUS | Status: DC

## 2024-04-17 NOTE — Progress Notes (Signed)
 Subjective: CC: Reports pain in LLQ is slightly improved today. Required PRN IV pain medication x 1 in the last 24 hours. She is tolerating CLD - drinking ~16oz/day. No n/v or worsening pain with PO intake. She is passing flatus and had 2 loose, non-bloody BM's yesterday.   Objective: Vital signs in last 24 hours: Temp:  [97.9 F (36.6 C)-98.6 F (37 C)] 98 F (36.7 C) (11/17 0844) Pulse Rate:  [65-80] 71 (11/17 0844) Resp:  [17-19] 18 (11/17 0844) BP: (124-150)/(63-75) 130/63 (11/17 0844) SpO2:  [97 %-98 %] 97 % (11/17 0844) Last BM Date : 04/16/24  Intake/Output from previous day: No intake/output data recorded. Intake/Output this shift: Total I/O In: 480 [P.O.:480] Out: -   PE: Gen:  Alert, NAD, pleasant Abd: Soft, ND, mild ttp in the LLQ without rigidity or guarding  Lab Results:  Recent Labs    04/16/24 0537 04/17/24 0554  WBC 13.3* 11.6*  HGB 10.0* 10.6*  HCT 30.5* 32.8*  PLT 245 276   BMET Recent Labs    04/16/24 0537 04/17/24 0554  NA 141 139  K 3.5 3.3*  CL 108 103  CO2 24 25  GLUCOSE 139* 131*  BUN 14 14  CREATININE 0.78 0.77  CALCIUM  8.2* 8.4*   PT/INR No results for input(s): LABPROT, INR in the last 72 hours. CMP     Component Value Date/Time   NA 139 04/17/2024 0554   NA 143 04/24/2020 0945   K 3.3 (L) 04/17/2024 0554   CL 103 04/17/2024 0554   CO2 25 04/17/2024 0554   GLUCOSE 131 (H) 04/17/2024 0554   BUN 14 04/17/2024 0554   BUN 15 04/24/2020 0945   CREATININE 0.77 04/17/2024 0554   CALCIUM  8.4 (L) 04/17/2024 0554   PROT 5.8 (L) 04/12/2024 0445   PROT 6.7 04/24/2020 0945   ALBUMIN 2.3 (L) 04/12/2024 0445   ALBUMIN 4.1 04/24/2020 0945   AST 15 04/12/2024 0445   ALT 18 04/12/2024 0445   ALKPHOS 60 04/12/2024 0445   BILITOT 1.4 (H) 04/12/2024 0445   BILITOT 0.5 04/24/2020 0945   GFRNONAA >60 04/17/2024 0554   GFRAA 95 04/24/2020 0945   Lipase     Component Value Date/Time   LIPASE <10 (L) 04/09/2024 0938     Studies/Results: No results found.  Anti-infectives: Anti-infectives (From admission, onward)    Start     Dose/Rate Route Frequency Ordered Stop   04/17/24 1330  Ampicillin-Sulbactam (UNASYN) 3 g in sodium chloride 0.9 % 100 mL IVPB        3 g 200 mL/hr over 30 Minutes Intravenous Every 6 hours 04/17/24 0827     04/13/24 1800  piperacillin-tazobactam (ZOSYN) IVPB 3.375 g  Status:  Discontinued        3.375 g 12.5 mL/hr over 240 Minutes Intravenous Every 8 hours 04/13/24 1502 04/17/24 0816   04/09/24 1800  piperacillin-tazobactam (ZOSYN) IVPB 3.375 g  Status:  Discontinued       Placed in Followed by Linked Group   3.375 g 12.5 mL/hr over 240 Minutes Intravenous Every 8 hours 04/09/24 1109 04/09/24 1123   04/09/24 1800  piperacillin-tazobactam (ZOSYN) IVPB 3.375 g  Status:  Discontinued       Placed in Followed by Linked Group   3.375 g 12.5 mL/hr over 240 Minutes Intravenous Every 8 hours 04/09/24 1201 04/13/24 1502   04/09/24 1215  piperacillin-tazobactam (ZOSYN) IVPB 3.375 g  Status:  Discontinued  3.375 g 100 mL/hr over 30 Minutes Intravenous  Once 04/09/24 1200 04/09/24 1201   04/09/24 1215  piperacillin-tazobactam (ZOSYN) IVPB 3.375 g       Placed in Followed by Linked Group   3.375 g 100 mL/hr over 30 Minutes Intravenous  Once 04/09/24 1201 04/09/24 1302   04/09/24 1115  piperacillin-tazobactam (ZOSYN) IVPB 3.375 g  Status:  Discontinued       Placed in Followed by Linked Group   3.375 g 100 mL/hr over 30 Minutes Intravenous  Once 04/09/24 1109 04/09/24 1123        Assessment/Plan Perforated diverticulitis vs enteritis  - CT scan done 11/9 and 11/12 - reviewed.  - Cont IV abx - She is HDS without fever, tachycardia or hypotension. WBC down at 11.6 from 13.3. She reports improved pain today. She is tolerating CLD and having bowel function. She has mild LLQ ttp on exam without peritonitis. I do not think she needs emergency surgery today. Will trial  FLD with repeat labs in the AM. If she continues to improve in the AM I think we can slowly advance her diet to work towards discharge and have her follow up for a colonoscopy in ~6-8 weeks. If she fails to improve, would recommend repeat CT A/P in the AM and pending results she may need OR for diagnostic laparoscopy. Discussed this with the patient and family.     FEN - FLD. IVF per TRH.  VTE - SCDs, Lovenox ID - Zosyn 11/9 - 11/17. Currently on Unasyn, would recommend changing back to Zosyn - message sent to [facility]   - Per TRH -  HFrEF Primary idiopathic dilated cardiomyopathy Hypertension  Hyperlipidemia  Prediabetes  Incidental findings - Left ovarian mass. Aortic Atherosclerosis.  I reviewed nursing notes, last 24 h vitals and pain scores, last 48 h intake and output, last 24 h labs and trends, and last 24 h imaging results.    LOS: 8 days    Summer Watkins, [facility] 04/17/2024, 11:09 AM Please see Amion for pager number during day hours 7:00am-4:30pm

## 2024-04-17 NOTE — Plan of Care (Signed)
  Problem: Education: Goal: Knowledge of General Education information will improve Description: Including pain rating scale, medication(s)/side effects and non-pharmacologic comfort measures Outcome: Progressing   Problem: Health Behavior/Discharge Planning: Goal: Ability to manage health-related needs will improve Outcome: Progressing   Problem: Clinical Measurements: Goal: Ability to maintain clinical measurements within normal limits will improve Outcome: Progressing Goal: Will remain free from infection Outcome: Progressing Goal: Diagnostic test results will improve Outcome: Progressing Goal: Respiratory complications will improve Outcome: Progressing Goal: Cardiovascular complication will be avoided Outcome: Progressing   Problem: Activity: Goal: Risk for activity intolerance will decrease Outcome: Progressing   Problem: Nutrition: Goal: Adequate nutrition will be maintained Outcome: Progressing   Problem: Coping: Goal: Level of anxiety will decrease Outcome: Progressing   Problem: Elimination: Goal: Will not experience complications related to bowel motility Outcome: Progressing Goal: Will not experience complications related to urinary retention Outcome: Progressing   Problem: Pain Managment: Goal: General experience of comfort will improve and/or be controlled Outcome: Progressing   Problem: Safety: Goal: Ability to remain free from injury will improve Outcome: Progressing   Problem: Skin Integrity: Goal: Risk for impaired skin integrity will decrease Outcome: Progressing   Problem: Education: Goal: Ability to demonstrate management of disease process will improve Outcome: Progressing Goal: Ability to verbalize understanding of medication therapies will improve Outcome: Progressing Goal: Individualized Educational Video(s) Outcome: Progressing   Problem: Activity: Goal: Capacity to carry out activities will improve Outcome: Progressing    Problem: Tissue Perfusion: Goal: Hemodynamically stable with effective tissue perfusion will improve Outcome: Progressing Goal: Postoperative complications will be avoided or minimized Outcome: Progressing   Problem: Bowel/Gastric: Goal: Gastrointestinal status for postoperative course will improve Outcome: Progressing Goal: GI tract motility and GI tissue perfusion will improve Outcome: Progressing Goal: Ability to demonstrate the techniques of an individualized bowel program will improve Outcome: Progressing   Problem: Education: Goal: Knowledge of the prescribed therapeutic regimen will improve Outcome: Progressing Goal: Knowledge of injury and care (of blunt abdominal trauma) will improve Outcome: Progressing   Problem: Coping: Goal: Exhibits appropriate coping mechanism and reduced anxiety resulting from physical and emotional stress Outcome: Progressing

## 2024-04-17 NOTE — Unmapped External Note (Signed)
  [facility]  - [facility] [phone] [place] [address] [phone] Fax: [phone]   PHYSICIAN SIGNATURE REQUIRED  Thank you for your referral. Please review the following Plan of Care and sign electronically, or fax signed paper copy to: [phone].   Please call Dept: [phone] with any questions or concerns.  Edsel Claudene Mate, PT  Physical Therapy Plan of Care  Initial Evaluation Diagnosis:  1. Right hip pain           Impairment List: Activity tolerance, Endurance, Functional limitations, Gait, Mobility, Pain, Strength   Plan and Recommendations: Plan Recommended PT Treatment/Interventions: Therapeutic exercise (02889); Therapeutic activity (97530); Manual therapy (97140); Gait training (02883); Dry needling (1-2 muscles) (79439); Neuromuscular re-education [phone]); Self-care/home management [phone]) PT Frequency: One time a month PT Duration: 6 months    Goals:  Goals Addressed             This Visit's Progress   . PT Goal       Short-Term Goals (1-3 months) Pain Reduction: Patient will report pain <= 4/10 during functional activities to improve tolerance for walking and sleeping. Hip Strength: Patient will demonstrate R hip abduction and extension strength >= 4/5 to improve pelvic stability. Flexibility: Patient will perform piriformis and ITB stretches with proper form and hold for 30 seconds  3 reps without pain increase. HEP Compliance: Patient will verbalize and demonstrate independence with prescribed HEP for pain management and mobility.  Long-Term Goals (3-6 months) Pain Resolution: Patient will report pain <= 2/10 during prolonged standing, walking, and sleeping on the affected side. Functional Mobility: Patient will improve LEFS score from 65/80 to >= 75/80, indicating improved ability to perform ADLs. Gait & Balance: Patient will maintain single-leg stance on R leg  for >= 10 seconds without loss of balance to improve stability. Activity Tolerance: Patient will tolerate 20-30 minutes of continuous walking without significant pain increase to return to community ambulation.          Certification: This is to certify that the above named patient, who is under my care, requires skilled Therapy services as described in the above treatment plan. I further certify that the services outlined in this plan are skilled and medically necessary. I have reviewed this plan for rehabilitation services, and I recommend that these services continue to meet the above stated goals and plan.

## 2024-04-17 NOTE — Progress Notes (Signed)
 [facility] Progress Note Patient: Summer Watkins FMW:[id number] DOB: 02-29-1951  DOA: 04/09/2024 DOS: the patient was seen and examined on 04/17/2024  Brief Hospital Course: 73/F with chronic systolic CHF, hypertension, prediabetes presented to [facility] ED with left lower quadrant abdominal pain nausea and vomiting, labs noted leukocytosis, lactic acidosis, CT abdomen pelvis noted left lower quadrant small bowel thickening with small amount of pneumoperitoneum, concerning for perforated viscus likely secondary to diverticulitis.  Made n.p.o., NG tube placed, started on IV Zosyn, general surgery following Repeat CT reassuring,   Assessment and Plan: Acute diverticulitis with localized perforation and small abscess Sepsis, POA CCS following, repeat CT largely stable, Continue IV Zosyn, now off IV fluids Advancing to full liquid diet. Will need repeat colonoscopy in near future Management per surgery.   Chronic systolic CHF Primary idiopathic dilated cardiomyopathy Last echo at outside hospital in 8/25 noted EF of 30-35% Heart failure meds including Lasix Entresto Coreg Aldactone held on admission restarted Entresto Aldactone and Coreg,  resume Lasix once started eating full liquid diet.   Hypertension Blood pressure stable. Monitor. Meds as above   Incidental left ovarian mass Will need further GYN workup as outpatient   Prediabetes A1c is 5.7.  Monitor.   Obesity Class 2 Body mass index is 36.51 kg/m.  Placing the pt at higher risk of poor outcomes.   Mild AKI,  Baseline serum creatinine 0.7.  Serum creatinine peaked at 1.2. Now Resolved  Hypokalemia. Replaced.   Subjective: Abdominal pain improving.  No nausea at the time of my evaluation while she was eating full liquid diet.  No vomiting in last 24 hours.  Passing gas and had TWo BM.  Physical Exam: General: in Mild distress, No Rash Cardiovascular: S1 and S2 Present, No Murmur Respiratory:  Good respiratory effort, Bilateral Air entry present. No Crackles, No wheezes Abdomen: Bowel Sound present, mild left lower quadrant tenderness Extremities: Improving edema Neuro: Alert and oriented x3, no new focal deficit   Data Reviewed: I have Reviewed nursing notes, Vitals, and Lab results. Since last encounter, pertinent lab results CBC and BMP   . I have ordered test including CBC and BMP  . I have discussed pt's care plan and test results with general surgery  .   Disposition: Status is: Inpatient Remains inpatient appropriate because: Monitor for improvement in abdominal pain  enoxaparin (LOVENOX) injection 40 mg Start: 04/12/24 1145 SCDs Start: 04/09/24 1618   Family Communication: No one at bedside Level of care: Med-Surg   Vitals:   04/17/24 0547 04/17/24 0844 04/17/24 1129 04/17/24 1604  BP: 137/70 130/63 120/68 (!) 142/58  Pulse: 65 71 84 81  Resp: 18 18 18 16   Temp: 97.9 F (36.6 C) 98 F (36.7 C) 98 F (36.7 C) 98 F (36.7 C)  TempSrc: Oral Oral Oral Oral  SpO2:  97% 97% 97%  Weight:      Height:         Author: Yetta Blanch, MD 04/17/2024 6:46 PM  Please look on [email or web address] to find out who is on call.

## 2024-04-17 NOTE — Progress Notes (Signed)
 Pt arrived to 6 north room 2 from 6 east. Alert and oriented x4. Pt ambulated to restroom in room with stand by assist with no issues. Pt returned back to bed. Bed in lowest position. Call light in reach. All needs met at this time.

## 2024-04-17 NOTE — Hospital Course (Addendum)
 73/F with chronic systolic CHF, hypertension, prediabetes presented to [facility] ED with left lower quadrant abdominal pain nausea and vomiting, labs noted leukocytosis, lactic acidosis, CT abdomen pelvis noted left lower quadrant small bowel thickening with small amount of pneumoperitoneum, concerning for perforated viscus likely secondary to diverticulitis.  Made n.p.o., NG tube placed, started on IV Zosyn, general surgery following Repeat CT reassuring,   Assessment and Plan: Acute diverticulitis with localized perforation and small abscess Sepsis, POA CCS following, repeat CT largely stable, Continue IV Zosyn, now off IV fluids Tolerating soft diet. Will need repeat colonoscopy in near future Management per surgery.   Chronic systolic CHF Primary idiopathic dilated cardiomyopathy Last echo at outside hospital in 8/25 noted EF of 30-35% Heart failure meds including Lasix Entresto Coreg Aldactone held on admission restarted Entresto Aldactone and Coreg,  On scheduled HCTZ at home and Lasix as needed.  Will only use Lasix daily going forward.   Hypertension Blood pressure stable. Monitor. Meds as above   Incidental left ovarian mass Will need further GYN workup as outpatient   Prediabetes A1c is 5.7.  Monitor.   Obesity Class 2 Body mass index is 36.51 kg/m.  Placing the pt at higher risk of poor outcomes.   Mild AKI,  Baseline serum creatinine 0.7.  Serum creatinine peaked at 1.2. Now Resolved  Hypokalemia. Replaced.

## 2024-04-17 NOTE — Progress Notes (Signed)
 OT Cancellation Note  Patient Details Name: Summer Watkins MRN: [id number] DOB: [birth year]   Cancelled Treatment:    Reason Eval/Treat Not Completed: (P) Patient declined, Pt just got back into bed, wanting to rest, asked to return tomorrow. [name] 04/17/2024, 3:30 PM

## 2024-04-18 DIAGNOSIS — R198 Other specified symptoms and signs involving the digestive system and abdomen: Secondary | ICD-10-CM | POA: Diagnosis not present

## 2024-04-18 LAB — CBC
HCT: 31.9 % — ABNORMAL LOW (ref 36.0–46.0)
Hemoglobin: 10.5 g/dL — ABNORMAL LOW (ref 12.0–15.0)
MCH: 28.3 pg (ref 26.0–34.0)
MCHC: 32.9 g/dL (ref 30.0–36.0)
MCV: 86 fL (ref 80.0–100.0)
Platelets: 300 K/uL (ref 150–400)
RBC: 3.71 MIL/uL — ABNORMAL LOW (ref 3.87–5.11)
RDW: 14.6 % (ref 11.5–15.5)
WBC: 10.7 K/uL — ABNORMAL HIGH (ref 4.0–10.5)
nRBC: 0 % (ref 0.0–0.2)

## 2024-04-18 MED ORDER — FUROSEMIDE 20 MG PO TABS
20.0000 mg | ORAL_TABLET | Freq: Every day | ORAL | Status: DC
  Administered 2024-04-18 – 2024-04-19 (×2): 20 mg via ORAL
  Filled 2024-04-18 (×2): qty 1

## 2024-04-18 MED ORDER — FLUCONAZOLE 150 MG PO TABS
150.0000 mg | ORAL_TABLET | Freq: Once | ORAL | Status: AC
  Administered 2024-04-18: 150 mg via ORAL
  Filled 2024-04-18: qty 1

## 2024-04-18 NOTE — Progress Notes (Signed)
 OT Cancellation Note  Patient Details Name: Summer Watkins MRN: [id number] DOB: April 11, 1951   Cancelled Treatment:    Reason Eval/Treat Not Completed: Other (comment) (pt eating breakfsat, OT to return after eating.)  Lucie JONETTA Kendall 04/18/2024, 9:14 AM

## 2024-04-18 NOTE — Progress Notes (Signed)
   04/18/24 0904  Vitals  BP 137/83  BP Location Left Arm  BP Method Automatic  Patient Position (if appropriate) Sitting  Pulse Rate 76  Pulse Rate Source Dinamap  Resp 17  Level of Consciousness  Level of Consciousness Alert  MEWS COLOR  MEWS Score Color Green  Oxygen Therapy  SpO2 99 %  O2 Device Room Air  Pain Assessment  Pain Scale 0-10  Pain Score 5  MEWS Score  MEWS Temp 0  MEWS Systolic 0  MEWS Pulse 0  MEWS RR 0  MEWS LOC 0  MEWS Score 0

## 2024-04-18 NOTE — Care Management Important Message (Signed)
 Important Message  Patient Details  Name: Summer Watkins MRN: [id number] Date of Birth: 04/03/1951   Important Message Given:  Yes - Medicare IM     Jennie Laneta Dragon 04/18/2024, 1:20 PM

## 2024-04-18 NOTE — Progress Notes (Addendum)
 Subjective: CC: Stable 5/10 pain in LLQ from yesterday - reports it feels sore. Tolerating FLD without n/v or worsening pain. Did not require PRN pain medications yesterday. Passing flatus. Liquid, non-bloody BM yesterday. She thinks she is getting a yeast infection and asked for fluconazole.   Objective: Vital signs in last 24 hours: Temp:  [97.4 F (36.3 C)-98 F (36.7 C)] 97.4 F (36.3 C) (11/18 0502) Pulse Rate:  [73-84] 73 (11/18 0502) Resp:  [16-18] 16 (11/18 0502) BP: (120-144)/(58-79) 137/64 (11/18 0502) SpO2:  [94 %-100 %] 94 % (11/18 0502) Last BM Date : 04/17/24  Intake/Output from previous day: 11/17 0701 - 11/18 0700 In: 765.7 [P.O.:720; IV Piggyback:45.7] Out: 150 [Urine:150] Intake/Output this shift: No intake/output data recorded.  PE: Gen:  Alert, NAD, pleasant Abd: Soft, ND, NT today. No rigidity or guarding  Lab Results:  Recent Labs    04/17/24 0554 04/18/24 0548  WBC 11.6* 10.7*  HGB 10.6* 10.5*  HCT 32.8* 31.9*  PLT 276 300   BMET Recent Labs    04/16/24 0537 04/17/24 0554  NA 141 139  K 3.5 3.3*  CL 108 103  CO2 24 25  GLUCOSE 139* 131*  BUN 14 14  CREATININE 0.78 0.77  CALCIUM  8.2* 8.4*   PT/INR No results for input(s): LABPROT, INR in the last 72 hours. CMP     Component Value Date/Time   NA 139 04/17/2024 0554   NA 143 04/24/2020 0945   K 3.3 (L) 04/17/2024 0554   CL 103 04/17/2024 0554   CO2 25 04/17/2024 0554   GLUCOSE 131 (H) 04/17/2024 0554   BUN 14 04/17/2024 0554   BUN 15 04/24/2020 0945   CREATININE 0.77 04/17/2024 0554   CALCIUM  8.4 (L) 04/17/2024 0554   PROT 5.8 (L) 04/12/2024 0445   PROT 6.7 04/24/2020 0945   ALBUMIN 2.3 (L) 04/12/2024 0445   ALBUMIN 4.1 04/24/2020 0945   AST 15 04/12/2024 0445   ALT 18 04/12/2024 0445   ALKPHOS 60 04/12/2024 0445   BILITOT 1.4 (H) 04/12/2024 0445   BILITOT 0.5 04/24/2020 0945   GFRNONAA >60 04/17/2024 0554   GFRAA 95 04/24/2020 0945   Lipase      Component Value Date/Time   LIPASE <10 (L) 04/09/2024 0938    Studies/Results: No results found.  Anti-infectives: Anti-infectives (From admission, onward)    Start     Dose/Rate Route Frequency Ordered Stop   04/17/24 1400  piperacillin-tazobactam (ZOSYN) IVPB 3.375 g        3.375 g 12.5 mL/hr over 240 Minutes Intravenous Every 8 hours 04/17/24 1151     04/17/24 1330  Ampicillin-Sulbactam (UNASYN) 3 g in sodium chloride 0.9 % 100 mL IVPB  Status:  Discontinued        3 g 200 mL/hr over 30 Minutes Intravenous Every 6 hours 04/17/24 0827 04/17/24 1151   04/13/24 1800  piperacillin-tazobactam (ZOSYN) IVPB 3.375 g  Status:  Discontinued        3.375 g 12.5 mL/hr over 240 Minutes Intravenous Every 8 hours 04/13/24 1502 04/17/24 0816   04/09/24 1800  piperacillin-tazobactam (ZOSYN) IVPB 3.375 g  Status:  Discontinued       Placed in Followed by Linked Group   3.375 g 12.5 mL/hr over 240 Minutes Intravenous Every 8 hours 04/09/24 1109 04/09/24 1123   04/09/24 1800  piperacillin-tazobactam (ZOSYN) IVPB 3.375 g  Status:  Discontinued       Placed in Followed by Linked Group  3.375 g 12.5 mL/hr over 240 Minutes Intravenous Every 8 hours 04/09/24 1201 04/13/24 1502   04/09/24 1215  piperacillin-tazobactam (ZOSYN) IVPB 3.375 g  Status:  Discontinued        3.375 g 100 mL/hr over 30 Minutes Intravenous  Once 04/09/24 1200 04/09/24 1201   04/09/24 1215  piperacillin-tazobactam (ZOSYN) IVPB 3.375 g       Placed in Followed by Linked Group   3.375 g 100 mL/hr over 30 Minutes Intravenous  Once 04/09/24 1201 04/09/24 1302   04/09/24 1115  piperacillin-tazobactam (ZOSYN) IVPB 3.375 g  Status:  Discontinued       Placed in Followed by Linked Group   3.375 g 100 mL/hr over 30 Minutes Intravenous  Once 04/09/24 1109 04/09/24 1123        Assessment/Plan Perforated diverticulitis vs enteritis  - CT scan done 11/9 and 11/12 - reviewed.  - Cont IV abx - She is HDS without fever,  tachycardia or hypotension. WBC down at 10.7 from 11.6.  - She is tolerating FLD without worsening pain, n/v. She has return of bowel function. No hematochezia.  - She is NT on exam today - No indication for emergency surgery - Trial soft diet today. If does well with this, likely can d/c home tomorrow on PO abx and have her follow up for a colonoscopy in ~6-8 weeks. If she fails to improve, would consider repeat CT A/P.    FEN - Soft. IVF per TRH.  VTE - SCDs, Lovenox ID - Zosyn 11/9 >>  - Per TRH -  HFrEF Primary idiopathic dilated cardiomyopathy Hypertension  Hyperlipidemia  Prediabetes  Incidental findings - Left ovarian mass. Aortic Atherosclerosis.  I reviewed nursing notes, last 24 h vitals and pain scores, last 48 h intake and output, last 24 h labs and trends, and last 24 h imaging results.    LOS: 9 days    Summer Watkins, [facility] 04/18/2024, 9:04 AM Please see Amion for pager number during day hours 7:00am-4:30pm

## 2024-04-18 NOTE — Progress Notes (Signed)
 [facility] Progress Note Patient: Summer Watkins FMW:[id number] DOB: 1951-04-03  DOA: 04/09/2024 DOS: the patient was seen and examined on 04/18/2024  Brief Hospital Course: 73/F with chronic systolic CHF, hypertension, prediabetes presented to [facility] ED with left lower quadrant abdominal pain nausea and vomiting, labs noted leukocytosis, lactic acidosis, CT abdomen pelvis noted left lower quadrant small bowel thickening with small amount of pneumoperitoneum, concerning for perforated viscus likely secondary to diverticulitis.  Made n.p.o., NG tube placed, started on IV Zosyn, general surgery following Repeat CT reassuring,   Assessment and Plan: Acute diverticulitis with localized perforation and small abscess Sepsis, POA CCS following, repeat CT largely stable, Continue IV Zosyn, now off IV fluids Tolerating soft diet. Will need repeat colonoscopy in near future Management per surgery.   Chronic systolic CHF Primary idiopathic dilated cardiomyopathy Last echo at outside hospital in 8/25 noted EF of 30-35% Heart failure meds including Lasix Entresto Coreg Aldactone held on admission restarted Entresto Aldactone and Coreg,  On scheduled HCTZ at home and Lasix as needed.  Will only use Lasix daily going forward.   Hypertension Blood pressure stable. Monitor. Meds as above   Incidental left ovarian mass Will need further GYN workup as outpatient   Prediabetes A1c is 5.7.  Monitor.   Obesity Class 2 Body mass index is 36.51 kg/m.  Placing the pt at higher risk of poor outcomes.   Mild AKI,  Baseline serum creatinine 0.7.  Serum creatinine peaked at 1.2. Now Resolved  Hypokalemia. Replaced.   Subjective: Abdominal pain resolved.  No nausea no vomiting no fever no chills.  Had a BM.  No blood in the stool.  Still has swelling in the legs.  Physical Exam: Basal crackles. S1-S2 present Bowel sounds in.  Nontender. Bilateral lower extremity edema  unchanged.  Data Reviewed: I have Reviewed nursing notes, Vitals, and Lab results. Since last encounter, pertinent lab results CBC and BMP   . I have ordered test including CBC and BMP  . I have discussed pt's care plan and test results with general surgery  .   Disposition: Status is: Inpatient Remains inpatient appropriate because: Monitor for improvement in oral intake  enoxaparin (LOVENOX) injection 40 mg Start: 04/12/24 1145 SCDs Start: 04/09/24 1618   Family Communication: No one at bedside Level of care: Med-Surg   Vitals:   04/17/24 2126 04/18/24 0502 04/18/24 0904 04/18/24 0933  BP: 126/65 137/64 137/83 (!) 140/70  Pulse: 76 73 76 76  Resp: 16 16 17 18   Temp: 97.8 F (36.6 C) (!) 97.4 F (36.3 C)  98.4 F (36.9 C)  TempSrc: Oral Oral  Oral  SpO2: 100% 94% 99% 100%  Weight:      Height:         Author: Yetta Blanch, MD 04/18/2024 3:38 PM  Please look on [email or web address] to find out who is on call.

## 2024-04-18 NOTE — Plan of Care (Signed)
  Problem: Education: Goal: Knowledge of General Education information will improve Description: Including pain rating scale, medication(s)/side effects and non-pharmacologic comfort measures Outcome: Progressing   Problem: Health Behavior/Discharge Planning: Goal: Ability to manage health-related needs will improve Outcome: Progressing   Problem: Clinical Measurements: Goal: Ability to maintain clinical measurements within normal limits will improve Outcome: Progressing Goal: Will remain free from infection Outcome: Progressing Goal: Diagnostic test results will improve Outcome: Progressing Goal: Respiratory complications will improve Outcome: Progressing Goal: Cardiovascular complication will be avoided Outcome: Progressing   Problem: Activity: Goal: Risk for activity intolerance will decrease Outcome: Progressing   Problem: Nutrition: Goal: Adequate nutrition will be maintained Outcome: Progressing   Problem: Coping: Goal: Level of anxiety will decrease Outcome: Progressing   Problem: Elimination: Goal: Will not experience complications related to bowel motility Outcome: Progressing Goal: Will not experience complications related to urinary retention Outcome: Progressing   Problem: Pain Managment: Goal: General experience of comfort will improve and/or be controlled Outcome: Progressing   Problem: Safety: Goal: Ability to remain free from injury will improve Outcome: Progressing   Problem: Skin Integrity: Goal: Risk for impaired skin integrity will decrease Outcome: Progressing   Problem: Education: Goal: Ability to demonstrate management of disease process will improve Outcome: Progressing Goal: Ability to verbalize understanding of medication therapies will improve Outcome: Progressing Goal: Individualized Educational Video(s) Outcome: Progressing   Problem: Activity: Goal: Capacity to carry out activities will improve Outcome: Progressing    Problem: Tissue Perfusion: Goal: Hemodynamically stable with effective tissue perfusion will improve Outcome: Progressing Goal: Postoperative complications will be avoided or minimized Outcome: Progressing   Problem: Bowel/Gastric: Goal: Gastrointestinal status for postoperative course will improve Outcome: Progressing Goal: GI tract motility and GI tissue perfusion will improve Outcome: Progressing Goal: Ability to demonstrate the techniques of an individualized bowel program will improve Outcome: Progressing   Problem: Education: Goal: Knowledge of the prescribed therapeutic regimen will improve Outcome: Progressing Goal: Knowledge of injury and care (of blunt abdominal trauma) will improve Outcome: Progressing   Problem: Coping: Goal: Exhibits appropriate coping mechanism and reduced anxiety resulting from physical and emotional stress Outcome: Progressing

## 2024-04-18 NOTE — Progress Notes (Signed)
 Occupational Therapy Treatment & Discharge Patient Details Name: Summer Watkins MRN: [id number] DOB: Mar 06, 1951 Today's Date: 04/18/2024   History of present illness Summer Watkins is a 73 yo female who [facility] ED with left lower quadrant abdominal pain nausea and vomiting, labs noted leukocytosis, lactic acidosis, CT abdomen pelvis noted left lower quadrant small bowel thickening with small amount of pneumoperitoneum, concerning for perforated viscus likely secondary to diverticulitis. Transferred to [facility].  PMHx:  chronic systolic CHF, hypertension, prediabetes   OT comments  Pt has met her acute OT goals. Overall she is mod I for mobility and ADLs. Pt denied pain throughout. She does not need continued acute OT or follow up. Encourage frequent mobilization and time spent OOB. OT to sign off.        If plan is discharge home, recommend the following:  A little help with bathing/dressing/bathroom;Assistance with cooking/housework;Assist for transportation;Help with stairs or ramp for entrance   Equipment Recommendations  Other (comment)       Precautions / Restrictions Precautions Precautions: None Restrictions Weight Bearing Restrictions Per Provider Order: No       Mobility Bed Mobility               General bed mobility comments: OOB on arrival    Transfers Overall transfer level: Modified independent                       Balance Overall balance assessment: Modified Independent               ADL either performed or assessed with clinical judgement   ADL Overall ADL's : Modified independent                   General ADL Comments: mod I for increased time and AD use. otherwise no assist needed. Pt very close to her functional baseline    Extremity/Trunk Assessment Upper Extremity Assessment Upper Extremity Assessment: Defer to OT evaluation   Lower Extremity Assessment Lower Extremity Assessment: Overall WFL for  tasks assessed        Vision   Vision Assessment?: No apparent visual deficits;Wears glasses for reading;Wears glasses for driving   Perception Perception Perception: Within Functional Limits   Praxis Praxis Praxis: WFL   Communication Communication Communication: No apparent difficulties   Cognition Arousal: Alert Behavior During Therapy: WFL for tasks assessed/performed Cognition: No apparent impairments                               Following commands: Intact        Cueing   Cueing Techniques: Verbal cues        General Comments VSS on RA    Pertinent Vitals/ Pain       Pain Assessment Pain Assessment: No/denies pain   Frequency  Min 2X/week        Progress Toward Goals  OT Goals(current goals can now be found in the care plan section)  Progress towards OT goals: Goals met/education completed, patient discharged from OT  Acute Rehab OT Goals Patient Stated Goal: to go home OT Goal Formulation: With patient Time For Goal Achievement: 04/26/24 Potential to Achieve Goals: Good ADL Goals Pt Will Perform Lower Body Bathing: with contact guard assist;with supervision;with adaptive equipment Pt Will Perform Lower Body Dressing: with contact guard assist;with supervision;with adaptive equipment Pt Will Transfer to Toilet: with modified independence;ambulating Pt Will Perform Tub/Shower Transfer:  with modified independence;ambulating         AM-PAC OT 6 Clicks Daily Activity     Outcome Measure   Help from another person eating meals?: None Help from another person taking care of personal grooming?: None Help from another person toileting, which includes using toliet, bedpan, or urinal?: None Help from another person bathing (including washing, rinsing, drying)?: None Help from another person to put on and taking off regular upper body clothing?: None Help from another person to put on and taking off regular lower body clothing?: None 6  Click Score: 24    End of Session Equipment Utilized During Treatment: Rolling walker (2 wheels)  OT Visit Diagnosis: Pain   Activity Tolerance Patient tolerated treatment well   Patient Left in chair;with call bell/phone within reach   Nurse Communication Mobility status        Time: 8892-8875 OT Time Calculation (min): 17 min  Charges: OT General Charges $OT Visit: 1 Visit OT Treatments $Self Care/Home Management : 8-22 mins  Lucie Kendall, OTR/L Acute Rehabilitation Services Office [phone] Secure Chat Communication Preferred   Lucie JONETTA Kendall 04/18/2024, 11:27 AM

## 2024-04-19 ENCOUNTER — Other Ambulatory Visit (HOSPITAL_COMMUNITY): Payer: Self-pay

## 2024-04-19 DIAGNOSIS — R198 Other specified symptoms and signs involving the digestive system and abdomen: Secondary | ICD-10-CM | POA: Diagnosis not present

## 2024-04-19 LAB — CBC
HCT: 35.9 % — ABNORMAL LOW (ref 36.0–46.0)
Hemoglobin: 11.5 g/dL — ABNORMAL LOW (ref 12.0–15.0)
MCH: 27.6 pg (ref 26.0–34.0)
MCHC: 32 g/dL (ref 30.0–36.0)
MCV: 86.1 fL (ref 80.0–100.0)
Platelets: 311 K/uL (ref 150–400)
RBC: 4.17 MIL/uL (ref 3.87–5.11)
RDW: 14.6 % (ref 11.5–15.5)
WBC: 10.6 K/uL — ABNORMAL HIGH (ref 4.0–10.5)
nRBC: 0 % (ref 0.0–0.2)

## 2024-04-19 LAB — BASIC METABOLIC PANEL WITH GFR
Anion gap: 14 (ref 5–15)
BUN: 11 mg/dL (ref 8–23)
CO2: 24 mmol/L (ref 22–32)
Calcium: 8.7 mg/dL — ABNORMAL LOW (ref 8.9–10.3)
Chloride: 102 mmol/L (ref 98–111)
Creatinine, Ser: 0.88 mg/dL (ref 0.44–1.00)
GFR, Estimated: >60 mL/min (ref >60)
Glucose, Bld: 88 mg/dL (ref 70–99)
Potassium: 3.7 mmol/L (ref 3.5–5.1)
Sodium: 140 mmol/L (ref 135–145)

## 2024-04-19 MED ORDER — AMOXICILLIN-POT CLAVULANATE 875-125 MG PO TABS: 1.0000 | ORAL_TABLET | Freq: Two times a day (BID) | ORAL | Status: DC

## 2024-04-19 MED ORDER — DOCUSATE SODIUM 100 MG PO CAPS
100.0000 mg | ORAL_CAPSULE | Freq: Two times a day (BID) | ORAL | 0 refills | Status: AC
  Filled 2024-04-19: qty 10, 5d supply, fill #0

## 2024-04-19 MED ORDER — POLYETHYLENE GLYCOL 3350 17 GM/SCOOP PO POWD
17.0000 g | Freq: Two times a day (BID) | ORAL | 0 refills | Status: AC
  Filled 2024-04-19: qty 238, 7d supply, fill #0

## 2024-04-19 MED ORDER — AMOXICILLIN-POT CLAVULANATE 875-125 MG PO TABS
1.0000 | ORAL_TABLET | Freq: Two times a day (BID) | ORAL | 0 refills | Status: AC
  Filled 2024-04-19: qty 14, 7d supply, fill #0

## 2024-04-19 MED ORDER — SPIRONOLACTONE 25 MG PO TABS
12.5000 mg | ORAL_TABLET | Freq: Every day | ORAL | 0 refills | Status: AC
  Filled 2024-04-19: qty 30, 60d supply, fill #0

## 2024-04-19 MED ORDER — OXYCODONE HCL 5 MG PO TABS
5.0000 mg | ORAL_TABLET | Freq: Three times a day (TID) | ORAL | 0 refills | Status: AC | PRN
  Filled 2024-04-19: qty 15, 5d supply, fill #0

## 2024-04-19 MED ORDER — FUROSEMIDE 20 MG PO TABS
20.0000 mg | ORAL_TABLET | Freq: Every day | ORAL | 0 refills | Status: AC
  Filled 2024-04-19: qty 30, 30d supply, fill #0

## 2024-04-19 NOTE — Progress Notes (Signed)
 Discharge instructions given to pt. Pt verbalized understanding of all teaching and had no further questions

## 2024-04-19 NOTE — Progress Notes (Signed)
 Let pt know to pick up discharge meds on way out of hospital. Pharmacy is on 2nd floor. Pt verbalized understanding. Pt discharged to home with daughter via wheelchair by volunteer staff.

## 2024-04-19 NOTE — Progress Notes (Addendum)
 Subjective: CC: Denies abdominal pain. Did not require PRN pain medications yesterday or today. Tolerating soft diet without n/v. Passing flatus. Liquid, non-bloody BM 2 days ago. No BM yesterday.  Afebrile. No tachycardia or hypotension. WBC stable at 10.6 from 10.7.   Objective: Vital signs in last 24 hours: Temp:  [97.8 F (36.6 C)-98.4 F (36.9 C)] 98.1 F (36.7 C) (11/19 0852) Pulse Rate:  [73-85] 85 (11/19 0906) Resp:  [16-18] 18 (11/19 0852) BP: (138-150)/(70-86) 150/82 (11/19 0906) SpO2:  [99 %-100 %] 100 % (11/19 0852) Last BM Date : 04/17/24  Intake/Output from previous day: 11/18 0701 - 11/19 0700 In: 920 [P.O.:920] Out: -  Intake/Output this shift: No intake/output data recorded.  PE: Gen:  Alert, NAD, pleasant Abd: Soft, ND, NT. No rigidity or guarding  Lab Results:  Recent Labs    04/18/24 0548 04/19/24 0837  WBC 10.7* 10.6*  HGB 10.5* 11.5*  HCT 31.9* 35.9*  PLT 300 311   BMET Recent Labs    04/17/24 0554  NA 139  K 3.3*  CL 103  CO2 25  GLUCOSE 131*  BUN 14  CREATININE 0.77  CALCIUM  8.4*   PT/INR No results for input(s): LABPROT, INR in the last 72 hours. CMP     Component Value Date/Time   NA 139 04/17/2024 0554   NA 143 04/24/2020 0945   K 3.3 (L) 04/17/2024 0554   CL 103 04/17/2024 0554   CO2 25 04/17/2024 0554   GLUCOSE 131 (H) 04/17/2024 0554   BUN 14 04/17/2024 0554   BUN 15 04/24/2020 0945   CREATININE 0.77 04/17/2024 0554   CALCIUM  8.4 (L) 04/17/2024 0554   PROT 5.8 (L) 04/12/2024 0445   PROT 6.7 04/24/2020 0945   ALBUMIN 2.3 (L) 04/12/2024 0445   ALBUMIN 4.1 04/24/2020 0945   AST 15 04/12/2024 0445   ALT 18 04/12/2024 0445   ALKPHOS 60 04/12/2024 0445   BILITOT 1.4 (H) 04/12/2024 0445   BILITOT 0.5 04/24/2020 0945   GFRNONAA >60 04/17/2024 0554   GFRAA 95 04/24/2020 0945   Lipase     Component Value Date/Time   LIPASE <10 (L) 04/09/2024 0938    Studies/Results: No results  found.  Anti-infectives: Anti-infectives (From admission, onward)    Start     Dose/Rate Route Frequency Ordered Stop   04/18/24 1100  fluconazole (DIFLUCAN) tablet 150 mg        150 mg Oral  Once 04/18/24 1007 04/18/24 1209   04/17/24 1400  piperacillin-tazobactam (ZOSYN) IVPB 3.375 g        3.375 g 12.5 mL/hr over 240 Minutes Intravenous Every 8 hours 04/17/24 1151     04/17/24 1330  Ampicillin-Sulbactam (UNASYN) 3 g in sodium chloride 0.9 % 100 mL IVPB  Status:  Discontinued        3 g 200 mL/hr over 30 Minutes Intravenous Every 6 hours 04/17/24 0827 04/17/24 1151   04/13/24 1800  piperacillin-tazobactam (ZOSYN) IVPB 3.375 g  Status:  Discontinued        3.375 g 12.5 mL/hr over 240 Minutes Intravenous Every 8 hours 04/13/24 1502 04/17/24 0816   04/09/24 1800  piperacillin-tazobactam (ZOSYN) IVPB 3.375 g  Status:  Discontinued       Placed in Followed by Linked Group   3.375 g 12.5 mL/hr over 240 Minutes Intravenous Every 8 hours 04/09/24 1109 04/09/24 1123   04/09/24 1800  piperacillin-tazobactam (ZOSYN) IVPB 3.375 g  Status:  Discontinued  Placed in Followed by Linked Group   3.375 g 12.5 mL/hr over 240 Minutes Intravenous Every 8 hours 04/09/24 1201 04/13/24 1502   04/09/24 1215  piperacillin-tazobactam (ZOSYN) IVPB 3.375 g  Status:  Discontinued        3.375 g 100 mL/hr over 30 Minutes Intravenous  Once 04/09/24 1200 04/09/24 1201   04/09/24 1215  piperacillin-tazobactam (ZOSYN) IVPB 3.375 g       Placed in Followed by Linked Group   3.375 g 100 mL/hr over 30 Minutes Intravenous  Once 04/09/24 1201 04/09/24 1302   04/09/24 1115  piperacillin-tazobactam (ZOSYN) IVPB 3.375 g  Status:  Discontinued       Placed in Followed by Linked Group   3.375 g 100 mL/hr over 30 Minutes Intravenous  Once 04/09/24 1109 04/09/24 1123        Assessment/Plan Perforated diverticulitis vs enteritis  - CT scan done 11/9 and 11/12 - reviewed.  - No indication for emergency  surgery - Okay for discharge on 7d of Augmentin and have her follow up for a colonoscopy in ~6-8 weeks. Discussed return precautions. Message sent to [facility].     FEN - Soft. IVF per TRH.  VTE - SCDs, Lovenox ID - Zosyn 11/9 >>  - Per TRH -  HFrEF Primary idiopathic dilated cardiomyopathy Hypertension  Hyperlipidemia  Prediabetes  Incidental findings - Left ovarian mass. Aortic Atherosclerosis.  I reviewed nursing notes, last 24 h vitals and pain scores, last 48 h intake and output, last 24 h labs and trends, and last 24 h imaging results.    LOS: 10 days    Summer Watkins, [facility] 04/19/2024, 9:19 AM Please see Amion for pager number during day hours 7:00am-4:30pm

## 2024-04-20 NOTE — Discharge Summary (Signed)
 Physician Discharge Summary   Patient: Summer Watkins MRN: [id number] DOB: [birth year]  Admit date:     04/09/2024  Discharge date: 04/19/2024  Discharge Physician: Yetta Blanch  PCP: Evangelina Tinnie Norris, PA-C  Recommendations at discharge: Follow-up with PCP in 1 week. Will require a GI follow-up in 6 to 8 weeks for repeat colonoscopy. Repeat BMP in 1 week.   Follow-up Information     Evangelina Tinnie Norris, PA-C. Schedule an appointment as soon as possible for a visit in 1 week(s).   Specialty: Internal Medicine Why: with BMP lab to look at kidney/electrolyte numbers, To discuss blood pressure meds Contact information: [address] [place] [phone]         gastroenterology. Schedule an appointment as soon as possible for a visit in 1 month(s).   Why: to arrange colonoscopy in 6-8 weeks.               Hospital Course: 73/F with chronic systolic CHF, hypertension, prediabetes presented to [facility] ED with left lower quadrant abdominal pain nausea and vomiting, labs noted leukocytosis, lactic acidosis, CT abdomen pelvis noted left lower quadrant small bowel thickening with small amount of pneumoperitoneum, concerning for perforated viscus likely secondary to diverticulitis.  Made n.p.o., NG tube placed, started on IV Zosyn , general surgery following Repeat CT reassuring,   Assessment and Plan: Acute diverticulitis with localized perforation and small abscess Sepsis, POA CCS following, repeat CT largely stable, Was treated with IV Zosyn , now off IV fluids Tolerating soft diet. Will need repeat colonoscopy in near future Patient to be discharged from surgical point of view.  Will discharge with oral Augmentin  for 7 more days. Continue soft diet.   Chronic systolic CHF Primary idiopathic dilated cardiomyopathy Last echo at outside hospital in 8/25 noted EF of 30-35% Heart failure meds including Lasix  Entresto  Coreg   Aldactone  held on admission restarted Entresto  Aldactone  and Coreg ,  On scheduled HCTZ at home and Lasix  as needed.  Will only use Lasix  daily going forward.   Hypertension Blood pressure stable. Blood pressure medication have been adjusted.  Outpatient follow-up with PCP for further adjustment.   Incidental left ovarian mass Will need further GYN workup as outpatient   Prediabetes A1c is 5.7.  Monitor.   Obesity Class 2 Body mass index is 36.51 kg/m.  Placing the pt at higher risk of poor outcomes.   Mild AKI,  Baseline serum creatinine 0.7.  Serum creatinine peaked at 1.2. Now Resolved  Hypokalemia. Replaced.   Pain control - [place]  Controlled Substance Reporting System database was reviewed. and patient was instructed, not to drive, operate heavy machinery, perform activities at heights, swimming or participation in water activities or provide baby-sitting services while on Pain, Sleep and Anxiety Medications; until their outpatient Physician has advised to do so again. Also recommended to not to take more than prescribed Pain, Sleep and Anxiety Medications.  Consultants:  General Surgery  Procedures performed:  None  DISCHARGE MEDICATION: Allergies as of 04/19/2024   No Known Allergies      Medication List     STOP taking these medications    hydrochlorothiazide 12.5 MG capsule Commonly known as: MICROZIDE       TAKE these medications    amoxicillin -clavulanate 875-125 MG tablet Commonly known as: AUGMENTIN  Take 1 tablet by mouth 2 (two) times daily for 7 days.   aspirin  EC 81 MG tablet Take 81 mg by mouth daily.   carvedilol  12.5 MG tablet Commonly known  as: COREG  Take 12.5 mg by mouth in the morning and at bedtime.   docusate sodium  100 MG capsule Commonly known as: COLACE Take 1 capsule (100 mg total) by mouth 2 (two) times daily.   Entresto  24-26 MG Generic drug: sacubitril -valsartan  Take 1 tablet by mouth 2 (two) times daily.    furosemide  20 MG tablet Commonly known as: LASIX  Take 1 tablet (20 mg total) by mouth daily. What changed:  when to take this reasons to take this   oxyCODONE  5 MG immediate release tablet Commonly known as: Oxy IR/ROXICODONE  Take 1 tablet (5 mg total) by mouth every 8 (eight) hours as needed for severe pain (pain score 7-10).   polyethylene glycol powder 17 GM/SCOOP powder Commonly known as: GLYCOLAX /MIRALAX  Dissolve 1 capful (17g) in 4-8 ounces of liquid and take by mouth 2 (two) times daily.   spironolactone  25 MG tablet Commonly known as: ALDACTONE  Take 0.5 tablets (12.5 mg total) by mouth daily. What changed: how much to take   Vitamin D  (Ergocalciferol ) 1.25 MG (50000 UNIT) Caps capsule Commonly known as: DRISDOL TAKE 1 CAPSULE BY MOUTH ONE TIME PER WEEK What changed: See the new instructions.       Disposition: Home Diet recommendation: Cardiac diet  Discharge Exam: Vitals:   04/18/24 2120 04/19/24 0334 04/19/24 0852 04/19/24 0906  BP: 138/86 (!) 143/70 (!) 150/82 (!) 150/82  Pulse: 73 81 85 85  Resp: 16 18 18    Temp: 97.9 F (36.6 C) 97.8 F (36.6 C) 98.1 F (36.7 C)   TempSrc: Oral Oral Oral   SpO2: 100% 99% 100%   Weight:      Height:       Clear to auscultation. Lower extremity edema seen. Bowel syndrome.  Nontender.  Filed Weights   04/09/24 0918 04/09/24 1520  Weight: 83.9 kg 90.5 kg   Condition at discharge: stable  The results of significant diagnostics from this hospitalization (including imaging, microbiology, ancillary and laboratory) are listed below for reference.   Imaging Studies: CT ABDOMEN PELVIS WO CONTRAST Result Date: 04/12/2024 CLINICAL DATA:  Diverticulitis complication suspected. EXAM: CT ABDOMEN AND PELVIS WITHOUT CONTRAST TECHNIQUE: Multidetector CT imaging of the abdomen and pelvis was performed following the standard protocol without IV contrast. RADIATION DOSE REDUCTION: This exam was performed according to the  departmental dose-optimization program which includes automated exposure control, adjustment of the mA and/or kV according to patient size and/or use of iterative reconstruction technique. COMPARISON:  CT abdomen pelvis dated 04/09/2024. FINDINGS: Evaluation of this exam is limited in the absence of intravenous contrast. Lower chest: Trace bilateral pleural effusions. Small pneumoperitoneum, relatively similar volume as the prior CT. There is small free fluid in the pelvis. Hepatobiliary: The liver is unremarkable. No biliary dilatation. The gallbladder is unremarkable. Pancreas: Unremarkable. No pancreatic ductal dilatation or surrounding inflammatory changes. Spleen: Normal in size without focal abnormality. Adrenals/Urinary Tract: The adrenal glands unremarkable. There is no hydronephrosis or nephrolithiasis on either side. The visualized ureters and urinary bladder appear unremarkable. Stomach/Bowel: Mild distal colonic diverticulosis. Mildly dilated small bowel in the anterior abdomen measures 3.4 cm in caliber, likely a reactive ileus. There is thickened appearance of several small bowel loops in the left hemiabdomen with infiltration of the associated mesentery suspicious for enteritis. Overall slight interval progression of the inflammatory process since the prior CT. The appendix is normal. Vascular/Lymphatic: Mild atherosclerotic calcification of the iliac arteries. The abdominal aorta and IVC are grossly unremarkable. No portal venous gas. There is no adenopathy. Reproductive: Hysterectomy.  Other: None Musculoskeletal: No acute or significant osseous findings. IMPRESSION: 1. Small pneumoperitoneum, relatively similar volume as the prior CT. 2. Findings suspicious for enteritis in the left hemiabdomen, slightly progressed since the prior CT. 3. Mildly dilated small bowel in the anterior abdomen, likely a reactive ileus. 4. Trace bilateral pleural effusions. Electronically Signed   By: Vanetta Chou M.D.    On: 04/12/2024 14:29   DG CHEST PORT 1 VIEW Result Date: 04/09/2024 EXAM: 1 VIEW(S) XRAY OF THE CHEST 04/09/2024 05:42:00 PM COMPARISON: 09/24/2022. CLINICAL HISTORY: Sepsis (HCC). FINDINGS: LINES, TUBES AND DEVICES: Enteric tube courses into the proximal stomach. LUNGS AND PLEURA: Mild right basilar atelectasis. No pulmonary edema. No pleural effusion. No pneumothorax. HEART AND MEDIASTINUM: No acute abnormality of the cardiac and mediastinal silhouettes. BONES AND SOFT TISSUES: No acute osseous abnormality. IMPRESSION: 1. No acute findings. Electronically signed by: Pinkie Pebbles MD 04/09/2024 07:44 PM EST RP Workstation: [id number]   DG Abd Portable 1V Result Date: 04/09/2024 EXAM: 1 VIEW XRAY OF THE ABDOMEN 04/09/2024 12:23:19 PM COMPARISON: None available. CLINICAL HISTORY: Encounter for nasogastric (NG) tube placement FINDINGS: LINES, TUBES AND DEVICES: Nasogastric tube terminates overthe gastric body with side port not well visualized. BOWEL: No gross free intraperitoneal air. BONES: No acute osseous abnormality. LOWER CHEST: Cardiomegaly. IMPRESSION: 1. Nasogastric terminating over the  gastric body. Electronically signed by: Rockey Kilts MD 04/09/2024 12:55 PM EST RP Workstation: [id number]   CT ABDOMEN PELVIS W CONTRAST Result Date: 04/09/2024 CLINICAL DATA:  Bowel obstruction suspected Diverticulitis, complication suspected EXAM: CT ABDOMEN AND PELVIS WITH CONTRAST TECHNIQUE: Multidetector CT imaging of the abdomen and pelvis was performed using the standard protocol following bolus administration of intravenous contrast. RADIATION DOSE REDUCTION: This exam was performed according to the departmental dose-optimization program which includes automated exposure control, adjustment of the mA and/or kV according to patient size and/or use of iterative reconstruction technique. CONTRAST:  80mL OMNIPAQUE  IOHEXOL  300 MG/ML  SOLN COMPARISON:  None Available. FINDINGS: Lower chest: No acute  abnormality. Hepatobiliary: No density of the liver which could reflect underlying hepatic steatosis. More focal fatty deposition along the falciform ligament. Gallbladder is unremarkable. No extrahepatic biliary ductal dilation. Pancreas: Unremarkable. No pancreatic ductal dilatation or surrounding inflammatory changes. Spleen: Normal in size without focal abnormality. Adrenals/Urinary Tract: Kidneys enhance symmetrically. Adrenal glands are unremarkable. No hydronephrosis. No obstructing nephrolithiasis. Bladder is decompressed. Stomach/Bowel: There is a small amount of pneumoperitoneum overlying the liver and centered in the upper abdomen. In the LEFT lower quadrant, there are several adjacent loops of small bowel which demonstrate bowel wall thickening and some intervening areas of fat stranding and trace amount of fluid. Diverticulosis of the sigmoid colon. There is some mild fat stranding along the sigmoid colon without bowel wall thickening; this is in the region of the [facility] loops of small bowel in the LEFT lower quadrant. Moderate predominately RIGHT-sided colonic stool burden. Small hiatal hernia. Appendix is normal. Vascular/Lymphatic: Atherosclerotic calcifications. No suspicious adenopathy noted. Reproductive: Status post hysterectomy. 28 mm LEFT ovarian low-density mass measuring slightly above fluid density. Other: No focal drainable fluid collection. Musculoskeletal: No acute or significant osseous findings. IMPRESSION: 1. There is a small amount of pneumoperitoneum centered in the upper abdomen. There are several adherent loops of small bowel in the LEFT lower quadrant which demonstrate bowel wall thickening and some intervening areas of fat stranding and trace amount of fluid. Findings are concerning for perforated viscus, likely secondary to a nonspecific infectious or inflammatory small bowel enteritis versus sequela of ingestion  of foreign body. 2. There is some mild fat stranding along the  sigmoid colon which is favored to be reactive to adjacent enteritis. No definitive acute diverticulitis. 3. 28 mm LEFT ovarian low-density mass measuring slightly above fluid density. Recommend further evaluation with nonemergent outpatient pelvic ultrasound. Aortic Atherosclerosis (ICD10-I70.0). These results were called by telephone at the time of interpretation on 04/09/2024 at 11:03 am to Dr. CARON SALT , who verbally acknowledged these results. Electronically Signed   By: Corean Salter M.D.   On: 04/09/2024 11:06    Microbiology: Results for orders placed or performed during the hospital encounter of 04/09/24  Culture, blood (Routine X 2) w Reflex to ID Panel     Status: None   Collection Time: 04/09/24  4:53 PM   Specimen: BLOOD  Result Value Ref Range Status   Specimen Description BLOOD SITE NOT SPECIFIED  Final   Special Requests   Final    BOTTLES DRAWN AEROBIC AND ANAEROBIC Blood Culture results may not be optimal due to an inadequate volume of blood received in culture bottles   Culture   Final    NO GROWTH 5 DAYS Performed at [facility], [address], [place], [place] [address]    Report Status 04/14/2024 FINAL  Final  Culture, blood (Routine X 2) w Reflex to ID Panel     Status: None   Collection Time: 04/09/24  4:54 PM   Specimen: BLOOD  Result Value Ref Range Status   Specimen Description BLOOD SITE NOT SPECIFIED  Final   Special Requests   Final    BOTTLES DRAWN AEROBIC ONLY Blood Culture results may not be optimal due to an inadequate volume of blood received in culture bottles   Culture   Final    NO GROWTH 5 DAYS Performed at [facility], [address], [place], [place] [address]    Report Status 04/14/2024 FINAL  Final   Labs: CBC: Recent Labs  Lab 04/15/24 0406 04/16/24 0537 04/17/24 0554 04/18/24 0548 04/19/24 0837  WBC 14.0* 13.3* 11.6* 10.7* 10.6*  HGB 11.0* 10.0* 10.6* 10.5* 11.5*  HCT 34.3* 30.5* 32.8* 31.9* 35.9*  MCV 88.6  87.1 87.5 86.0 86.1  PLT 278 245 276 300 311   Basic Metabolic Panel: Recent Labs  Lab 04/14/24 0316 04/15/24 0406 04/16/24 0537 04/17/24 0554 04/19/24 0837  NA 141 142 141 139 140  K 3.8 3.7 3.5 3.3* 3.7  CL 112* 108 108 103 102  CO2 20* 22 24 25 24   GLUCOSE 173* 155* 139* 131* 88  BUN 26* 19 14 14 11   CREATININE 0.98 0.75 0.78 0.77 0.88  CALCIUM  8.7* 8.6* 8.2* 8.4* 8.7*   Liver Function Tests: No results for input(s): AST, ALT, ALKPHOS, BILITOT, PROT, ALBUMIN in the last 168 hours. CBG: No results for input(s): GLUCAP in the last 168 hours.  Discharge time spent: greater than 30 minutes.  Author: Yetta Blanch, MD  [facility] 04/19/2024

## 2024-05-03 NOTE — Progress Notes (Signed)
 [facility] post TCM follow up  Date of call: 05/03/24  Discharged from: [name]   Updates/Changes since last encounter: pt reports she is doing okay at home. She reports she is taking miralax  daily--but is only having a bowel movement every other day right now. She reports she had two falls after her discharge from the hospital. She reports she hasn't had any recently--but she is wanting home health PT to help her build her strength back. She asked me to message PCP about it. I will relay the message to PCP. She reports she is walking okay without assistance at home. She reports she doesn't feel strong enough to drive yet. She reports she is seeing Dr. Kristie on the 9th of this month. She reports she called the OBGYN and she is seeing them in January. She reports she followed up with PCP on Friday last week. She denies any other needs or concerns at this time.   Current Questions/Concerns: wants home health PT ordered--I will make PCP aware.   Is patient candidate for Navigation: I will continue to follow   Electronically signed by: [name], RN 05/03/2024 10:17 AM

## 2024-05-12 ENCOUNTER — Other Ambulatory Visit (HOSPITAL_COMMUNITY): Payer: Self-pay

## 2024-05-18 NOTE — Telephone Encounter (Signed)
 Patient informed.

## 2024-05-24 NOTE — Progress Notes (Signed)
 [facility] Population [facility] post TCM follow up  Date of call: 05/24/24  Discharged from: [name]   Updates/Changes since last encounter: pt reports she is doing good. She reports she is working with PT and getting her strength back. She denies any more recent falls. She denies any abdominal pain, nausea, vomiting, or constipation. She reports she is taking her miralax . She reports she is getting a colonoscopy in January. She asked me to see if there was any more diet information I can send her. I will look at this. She reports she has all her current medications at home. She reports she plans to follow up with PCP soon as scheduled. She denies any other needs or concerns at this time.   Current Questions/Concerns: wants more diet information--I will see if there is anything else I can send her.   Is patient candidate for Navigation: no, not at this time.   Electronically signed by: [name], RN 05/24/2024 11:01 AM

## 2024-05-31 ENCOUNTER — Other Ambulatory Visit: Payer: Self-pay | Admitting: Gastroenterology

## 2024-06-08 ENCOUNTER — Other Ambulatory Visit (HOSPITAL_COMMUNITY): Payer: Self-pay

## 2024-06-09 NOTE — Progress Notes (Signed)
 [facility]  - [facility]NAL MEDICINE [place] Follow Up Visit Jackalyn Haith DOB: Jun 03, 1950  MRN: [id number]  Visit Date: 06/09/2024 Encounter Provider: Tinnie Almarie Forts, PA-C  PCP: Tinnie Almarie Forts, PA-C  Subjective:    Chief Complaint  Patient presents with   Hypertension    Pt states she is NOT fasting and hasn't been checking her BP at home.    Strength Check    Pt states her strength is improving. She is not 100% yet, but is more active.   Hypertension Patient is here for follow-up of elevated blood pressure. She is taking medication as prescribed and feels well today. Denies side effects from the medication. Patient denies chest pain, dyspnea, exertional chest pressure/discomfort, palpitations, syncope and worsening edema.  BP Readings from Last 3 Encounters:  06/09/24 (!) 98/54  05/08/24 100/67  04/28/24 138/70   She has had follow up with Dr. Kristie (GI - recommended colace and miralax  and colonoscopy) as well as Dr. Marget (GYN - recommended repeat u/s in 3 months).  The following portions of the patient's history were reviewed and updated as appropriate: Patient Active Problem List   Diagnosis Date Noted   Hypomagnesemia 03/16/2024   Heart failure with reduced ejection fraction (CMD) 02/03/2024   Hypertensive heart disease with chronic systolic congestive heart failure    (CMD)    Diverticular disease of colon    Chronic idiopathic constipation    Vitamin D  deficiency    Impaired fasting glucose    Hyperlipidemia    Obesity due to excess calories with serious comorbidity    Primary idiopathic dilated cardiomyopathy    (CMD)    Primary osteoarthritis of first carpometacarpal joint of right hand    Ulnocarpal abutment syndrome, right    Chronic systolic congestive heart failure    (CMD)    Benign essential hypertension      Current Outpatient Medications  Medication Instructions   aspirin  81 mg chewable  tablet Take  by mouth.   carvedilol  (COREG ) 12.5 mg, oral, 2 times daily with meals, Take with food.   ergocalciferol  (VITAMIN D2) 50,000 Units, oral, Weekly   furosemide  (LASIX ) 20 mg, oral, Daily   hydrocortisone  2.5 % cream 2 times daily   hydrOXYzine pamoate (VISTARIL) 25 mg capsule TAKE 1 TO 2 CAPSULES BY MOUTH NIGHTLY AS NEEDED   mupirocin (BACTROBAN) 2 % ointment APPLY A SMALL AMOUNT TO THE AFFECTED AREA BY TOPICAL ROUTE 3 TIMES A DAY AS NEEDED   nebulizers misc Use as directed.   sacubitriL -valsartan  (Entresto ) 24-26 mg per tablet 1 tablet, oral, 2 times daily   spironolactone  (ALDACTONE ) 25 mg, oral, Daily      She  has a past surgical history that includes Partial hysterectomy. Her family history includes Cancer in her sister; Hypertension in her mother; Stroke in her mother. She is allergic to rosuvastatin  and prednisone..   Review of Systems - History obtained from the patient Complete ROS negative except those noted in the HPI or elsewhere in note  Objective  Objective    Physical Exam Vital signs and nursing note reviewed. BP (!) 98/54   Pulse 74   Temp 97.7 F (36.5 C) (Temporal)   Ht 1.575 m (5' 2)   Wt 81.3 kg (179 lb 3.2 oz)   SpO2 99%   Breastfeeding No   BMI 32.78 kg/m    General appearance: alert, appears stated age and cooperative Head: Normocephalic and atrauamatic Eyes: Conjunctiva clear, EOMs intact, no scleral icterus  noted Neck: no adenopathy, no carotid bruit, no JVD; neck is supple, symmetrical; trachea midline and thyroid not enlarged, symmetric, no tenderness/mass/nodules Heart: regular rate and rhythm, S1, S2 normal, no murmur, click, rub or gallop Lungs: clear to auscultation bilaterally; no wheezes, rhonchi, crackles, or rales and in no acute distress. Breathing is unlabored. Extremities: extremities normal, atraumatic, no cyanosis or edema Pulses: 2+ and symmetric  Psych: Mood and affect appropriate Skin: color, texture, and  turgor normal; no visualized rashes or lesions   Recent lab work reviewed and analyzed: Lab Results  Component Value Date   WBC 7.80 04/28/2024   HGB 11.9 (L) 04/28/2024   HCT 36.6 04/28/2024   MCV 86.1 04/28/2024   PLT 364 04/28/2024   ALT 16 11/16/2023   AST 15 11/16/2023   CREATININE 0.77 04/28/2024   BUN 11 04/28/2024   EGFR 82 04/28/2024   NA 144 04/28/2024   K 4.1 04/28/2024   CL 101 04/28/2024   CO2 36 (H) 04/28/2024   VITD 60.1 11/16/2023   HGBA1C 5.8 (H) 11/16/2023   LDLCALC 187 (H) 11/16/2023     Recent imaging reviewed and analyzed: Transthoracic echo (TTE) complete                                                                                                      Version  1                                                                                                      Study ID  [id number]                                                                   +--------------------------------------------------+                           +----------+  [facility]                                                                                                                             [facility]                                        +--------------------------------------------------+                                                                                                                                             +----------+ [facility] [address] Transthoracic Echocardiogram Report Name  MARELYN, ROUSER                       Study Date  06-08-2024, 10  18 AM                 Height  62 in MRN  [id number]                                      Patient Location  [facility]                       Weight  185.003 lb DOB  Oct 28, 1950  [id number]                         Birth Gender  Female                              BSA  1.85 m Age  74 Years                                       Ethnicity  3                                     BP  100 -  67 mmHg Reason For Study  heart failure with reduced ejection fraction History  heart failure with reduced ejection fraction Ordering Physician  LORRENE RO NICOLE Performed By  CHARLINE COMO Fellow  Cecelia Broody, [phone] Referring Physician  LORRENE RO NICOLE PROCEDURE A two-dimensional transthoracic echocardiogram with color flow, Doppler and global longitudinal strain imaging was performed. Image Quality  Technically adequate. LV ejection fraction = 35-40%. Global hypokinesis. LV Global L Strain =-13.7% which is reduced. Impaired relaxation. The right ventricle is normal in size and function. The left atrial size is normal. There is aortic valve sclerosis. There is trace aortic regurgitation. There is mild mitral regurgitation. There is trace tricuspid regurgitation. The ascending aorta is normal size. There is trivial anterior pericardial effusion. LEFT VENTRICLE The left ventricular size is normal. There is normal left ventricular wall thickness. LV ejection fraction = 35-40%. LV Global L Strain =-13.7%. RIGHT VENTRICLE The right ventricle is normal in size and function. LEFT ATRIUM The left atrial size is normal. RIGHT ATRIUM Right atrial size is normal. There is no Doppler evidence for a patent foramen ovale. AORTIC VALVE The aortic valve is trileaflet. There is aortic valve sclerosis. There is no aortic stenosis. There is trace aortic regurgitation. MITRAL VALVE There is mild mitral annular calcification. There is mild mitral regurgitation. TRICUSPID VALVE Structurally normal tricuspid valve. There is trace tricuspid regurgitation. There was insufficient TR detected to calculate RV systolic pressure. PULMONIC VALVE The pulmonic valve is not well visualized. Trace pulmonic valvular  regurgitation. ARTERIES The aortic sinus is normal size. The ascending aorta is normal size. VENOUS IVC size was normal. Normal IVC size and collapsibility; Right atrial pressure is estimated to be 3 mm Hg. EFFUSION There is trivial pericardial effusion. MMode-2D Measurements & Calculations asc Aorta Diam  2.8 cm                EDV MOD-sp2   81.6 ml                 EDV MOD-sp4   97.1 ml    EF A4C  38.5 % ESV MOD-sp2   48.2 ml                 ESV MOD-sp4   59.7 ml                 IVSd  0.90 cm            LA dim  2.9 cm LA ESV  BP   35.5 ml                  LA ESV Index  A2C   22.5 ml-m       LA ESV Index  A4C   15.8 ml-m        LA ESV Index  BP   19.2 ml-m LVIDd  4.5 cm                          LVIDs  3.6 cm                          LVOT diam  1.96 cm     LVPWd  0.90 cm SV A4C  37.4 ml Doppler Measurements & Calculations Ao max PG  4.6 mmHg                    Ao mean PG  2.00 mmHg  Ao V2 max  107.0 cm-sec Ao V2 mean  71.9 cm-sec Ao V2 VTI  19.6 cm                      AS Dimensionless Index  VTI   0.69    AVAi VTI  cm^2-m^2  1.13 cm           E-Lat E  5.9 E-Med E  11.4                         Lat Peak E  Vel  8.8 cm-sec           LV V1 VTI  13.6 cm       Med Peak E  Vel  4.6 cm-sec MV A max vel  108.0 cm-sec             MV dec time  0.23 sec                  MV E max vel  52.0 cm-sec               MV max PG  5.0 mmHg MV mean PG  1.80 mmHg                 MV V2 max  111.3 cm-sec               MV V2 mean  62.1 cm-sec  MV V2 VTI  33.9 cm SV index LVOT   22.2 ml-m Other Measurements & Calculations AVA  VTI   2.09 cm                  BSA  1.85 m                          MV E-A  0.48              MVA VTI   1.21 cm SI MOD-sp4   20.2 ml-m                SV LVOT   41.0 ml                      SV MOD-sp4   37.4 ml ______________________________________________________________________________                                                       Cecelia Broody, [phone]    06-08-2024, 1  02 PM   Results for orders placed or performed in visit on 06/08/24  Transthoracic echo (TTE) complete   Collection Time: 06/08/24 10:45 AM  Result Value Ref Range   Echo LVEF 35-40    GLS -13.7         Assessment/Plan    1. Benign essential hypertension (Primary) A little on the soft side but she is asymptomatic. Current medicines used for heart failure; keep upcoming appt with cardiology.  2. Physical deconditioning Improving   3. Ovarian mass, left Per patient Dr. Marget recommended 3 month f/u to recheck that it is not changing .  4. Chronic idiopathic constipation Given info on colace and miralax     Patient expresses understanding of their current medications and use.  If a new prescription  was given today, then I discussed potential side effects, drug interactions, instructions for taking the medication, and the consequences of not taking it.   Patient verbalized an understanding of these instructions. Patient's medical and personal goals were discussed today. Barriers to current goals: none evident The following portions of the patient's history were reviewed and updated as appropriate: allergies, current medications, past family history, past medical history, past social history, past surgical history and problem list. Return in about 3 months (around 09/07/2024) for HTN, HLD (morning- fasting). Call sooner if need arises.   Future Appointments  Date Time Provider Department Center  06/26/2024 10:00 AM Jolaine Nat Ferretti, NP [facility] [address] [facility]  07/07/2024 10:30 AM Charon Flavia Bracket, NP [facility] [address]  09/12/2024  9:00 AM [facility] [place] NURSE [facility] [place] [facility] [address]  09/15/2024  1:40 PM Lauren Almarie Forts, PA-C [facility] [place] [facility] [address]    This document serves as a record of services personally performed by Tinnie Forts, PA-C.  It was created on their behalf by Ona LITTIE Mandril, CMA, a trained medical scribe, and Certified  Medical Assistant (CMA). During the course of documenting the history, physical exam and medical decision making, I was functioning as a stage manager. The creation of this record is the providers dictation and/or activities during the visit.   Electronically signed by Ona LITTIE Mandril, CMA 06/09/2024 11:15 AM    I agree the documentation is accurate and complete.  Electronically signed by: Tinnie Almarie Forts, PA-C 06/09/2024 12:03 PM

## 2024-06-09 NOTE — Patient Instructions (Signed)
 05/09/2024 Chronic idiopathic constipation (ICD-10 - K59.04)   Patient advised to take OTC Colace (Docusate Sodium ) 100 mg (1-2) PO QHS. Patient was advised to take Miralax  17 gm in 8 ounces of water (1-2) times per day for bouts of severe constipation. She has been advised to minimize use of all narcotics and this can worsen her constipation.

## 2024-06-16 ENCOUNTER — Encounter (HOSPITAL_COMMUNITY): Payer: Self-pay | Admitting: Gastroenterology

## 2024-06-30 ENCOUNTER — Other Ambulatory Visit: Payer: Self-pay

## 2024-06-30 ENCOUNTER — Ambulatory Visit (HOSPITAL_COMMUNITY): Admitting: Anesthesiology

## 2024-06-30 ENCOUNTER — Encounter (HOSPITAL_COMMUNITY): Payer: Self-pay | Admitting: Gastroenterology

## 2024-06-30 ENCOUNTER — Ambulatory Visit (HOSPITAL_COMMUNITY)
Admission: RE | Admit: 2024-06-30 | Discharge: 2024-06-30 | Disposition: A | Discharge status: Home / Self Care | Attending: Gastroenterology | Admitting: Gastroenterology

## 2024-06-30 ENCOUNTER — Encounter (HOSPITAL_COMMUNITY)
Admission: RE | Disposition: A | Discharge status: Home / Self Care | Payer: Self-pay | Source: Home / Self Care | Attending: Gastroenterology

## 2024-06-30 DIAGNOSIS — Z8249 Family history of ischemic heart disease and other diseases of the circulatory system: Secondary | ICD-10-CM | POA: Insufficient documentation

## 2024-06-30 DIAGNOSIS — I110 Hypertensive heart disease with heart failure: Secondary | ICD-10-CM | POA: Diagnosis not present

## 2024-06-30 DIAGNOSIS — I5022 Chronic systolic (congestive) heart failure: Secondary | ICD-10-CM

## 2024-06-30 DIAGNOSIS — D122 Benign neoplasm of ascending colon: Secondary | ICD-10-CM

## 2024-06-30 DIAGNOSIS — I509 Heart failure, unspecified: Secondary | ICD-10-CM | POA: Insufficient documentation

## 2024-06-30 DIAGNOSIS — E7800 Pure hypercholesterolemia, unspecified: Secondary | ICD-10-CM | POA: Diagnosis not present

## 2024-06-30 DIAGNOSIS — K5730 Diverticulosis of large intestine without perforation or abscess without bleeding: Secondary | ICD-10-CM | POA: Insufficient documentation

## 2024-06-30 DIAGNOSIS — Z8719 Personal history of other diseases of the digestive system: Secondary | ICD-10-CM | POA: Insufficient documentation

## 2024-06-30 DIAGNOSIS — M1990 Unspecified osteoarthritis, unspecified site: Secondary | ICD-10-CM | POA: Insufficient documentation

## 2024-06-30 MED ORDER — LIDOCAINE 2% (20 MG/ML) 5 ML SYRINGE
INTRAMUSCULAR | Status: DC | PRN
  Administered 2024-06-30: 50 mg via INTRAVENOUS

## 2024-06-30 MED ORDER — PROPOFOL 10 MG/ML IV BOLUS
INTRAVENOUS | Status: DC | PRN
  Administered 2024-06-30: 40 mg via INTRAVENOUS

## 2024-06-30 MED ORDER — PROPOFOL 500 MG/50ML IV EMUL
INTRAVENOUS | Status: DC | PRN
  Administered 2024-06-30: 125 ug/kg/min via INTRAVENOUS

## 2024-06-30 MED ORDER — SODIUM CHLORIDE 0.9 % IV SOLN: INTRAVENOUS | Status: DC

## 2024-06-30 NOTE — H&P (Signed)
 Summer Watkins HPI: This 74 year old black female presents to the office for a hospital follow up. She was at [facility] on 04/10/2024 for LLQ pain. She had a CT scan of the abdomen and pelvis and was noted to have compiicated diverticulitis with a  localized perforation and small abscess with complicated by sepsis. She was then transported by EMS to [facility]. She was admitted from 04/10/2024-04/19/2024 when she was aggressively treated with IV antibiotics. She was discharged with 7-day course of antibiotics. The LLQ pain has since resolved. She takes MiraLAX  on a daily basis to help regulate BM's. She has 1-2 BM's per day with no obvious blood or mucus in the stool. She has good appetite and her weight has been stable. She denies having any complaints of nausea, vomiting, acid reflux, dysphagia or odynophagia. She denies having a family history of colon cancer, celiac sprue, or IBD. Her last colonoscopy was done 02/13/2022 that revealed a couple of diverticula in the entire colon along with internal hemorrhoids.  Past Medical History:  Diagnosis Date   Cardiomyopathy (HCC)    CHF (congestive heart failure) (HCC)    High blood pressure    Hyperlipidemia     Past Surgical History:  Procedure Laterality Date   PARTIAL HYSTERECTOMY  2002    Family History  Problem Relation Age of Onset   Stroke Mother    Hypertension Mother    Healthy Father     Social History:  reports that she has never smoked. She has never used smokeless tobacco. She reports that she does not drink alcohol and does not use drugs.  Allergies: Allergies[1]  Medications: Scheduled: Continuous:  sodium chloride  20 mL/hr at 06/30/24 0820    No results found for this or any previous visit (from the past 24 hours).   No results found.  ROS:  As stated above in the HPI otherwise negative.  Blood pressure 100/61, temperature (!) 97.2 F (36.2 C), temperature source Temporal, resp. rate 17, height 5' 2  (1.575 m), weight 79.4 kg, SpO2 97%.    PE: Gen: NAD, Alert and Oriented HEENT:  Summer Watkins/AT, EOMI Neck: Supple, no LAD Lungs: CTA Bilaterally CV: RRR without M/G/R ABD: Soft, NTND, +BS Ext: No C/C/E  Assessment/Plan: 1) Recent diverticulitis - colonoscopy to exclude luminal pathology.  Summer Watkins D 06/30/2024, 9:34 AM        [1] No Known Allergies

## 2024-06-30 NOTE — Anesthesia Procedure Notes (Signed)
 Procedure Name: MAC Date/Time: 06/30/2024 10:17 AM  Performed by: Judythe Tanda Aran, CRNAPre-anesthesia Checklist: Patient identified, Emergency Drugs available, Suction available and Patient being monitored Patient Re-evaluated:Patient Re-evaluated prior to induction Oxygen Delivery Method: Nasal cannula

## 2024-06-30 NOTE — Anesthesia Preprocedure Evaluation (Addendum)
"                                    Anesthesia Evaluation  Patient identified by MRN, date of birth, ID band Patient awake    Reviewed: Allergy & Precautions, H&P , NPO status , Patient's Chart, lab work & pertinent test results  Airway Mallampati: I  TM Distance: >3 FB Neck ROM: Full    Dental no notable dental hx. (+) Partial Lower, Dental Advisory Given, Teeth Intact   Pulmonary neg pulmonary ROS   Pulmonary exam normal breath sounds clear to auscultation       Cardiovascular Exercise Tolerance: Good hypertension, Pt. on medications +CHF  negative cardio ROS Normal cardiovascular exam Rhythm:Regular Rate:Normal     Neuro/Psych negative neurological ROS  negative psych ROS   GI/Hepatic negative GI ROS, Neg liver ROS,,,  Endo/Other  negative endocrine ROS    Renal/GU negative Renal ROS  negative genitourinary   Musculoskeletal negative musculoskeletal ROS (+) Arthritis , Osteoarthritis,    Abdominal   Peds negative pediatric ROS (+)  Hematology negative hematology ROS (+)   Anesthesia Other Findings   Reproductive/Obstetrics negative OB ROS                              Anesthesia Physical Anesthesia Plan  ASA: 3  Anesthesia Plan: MAC   Post-op Pain Management: Minimal or no pain anticipated   Induction: Intravenous  PONV Risk Score and Plan: 2 and Propofol  infusion and Treatment may vary due to age or medical condition  Airway Management Planned: Mask and Natural Airway  Additional Equipment: None  Intra-op Plan:   Post-operative Plan:   Informed Consent: I have reviewed the patients History and Physical, chart, labs and discussed the procedure including the risks, benefits and alternatives for the proposed anesthesia with the patient or authorized representative who has indicated his/her understanding and acceptance.       Plan Discussed with: Anesthesiologist and CRNA  Anesthesia Plan Comments:           Anesthesia Quick Evaluation  "

## 2024-06-30 NOTE — Discharge Instructions (Signed)

## 2024-06-30 NOTE — Transfer of Care (Signed)
 Immediate Anesthesia Transfer of Care Note  Patient: Summer Watkins  Procedure(s) Performed: COLONOSCOPY POLYPECTOMY, INTESTINE  Patient Location: Endoscopy Unit  Anesthesia Type:MAC  Level of Consciousness: drowsy  Airway & Oxygen Therapy: Patient Spontanous Breathing and Patient connected to nasal cannula oxygen  Post-op Assessment: Report given to RN and Post -op Vital signs reviewed and stable  Post vital signs: Reviewed and stable  Last Vitals:  Vitals Value Taken Time  BP    Temp    Pulse 86 06/30/24 10:39  Resp 15 06/30/24 10:39  SpO2 100 % 06/30/24 10:39  Vitals shown include unfiled device data.  Last Pain:  Vitals:   06/30/24 0818  TempSrc: Temporal  PainSc: 0-No pain      Patients Stated Pain Goal: 0 (06/30/24 0818)  Complications: No notable events documented.

## 2024-06-30 NOTE — Anesthesia Postprocedure Evaluation (Signed)
"   Anesthesia Post Note  Patient: Summer Watkins  Procedure(s) Performed: COLONOSCOPY POLYPECTOMY, INTESTINE     Patient location during evaluation: PACU Anesthesia Type: MAC Level of consciousness: awake and alert Pain management: pain level controlled Vital Signs Assessment: post-procedure vital signs reviewed and stable Respiratory status: spontaneous breathing, nonlabored ventilation, respiratory function stable and patient connected to nasal cannula oxygen Cardiovascular status: stable and blood pressure returned to baseline Postop Assessment: no apparent nausea or vomiting Anesthetic complications: no   No notable events documented.  Last Vitals:  Vitals:   06/30/24 1050 06/30/24 1100  BP: (!) 100/55 (!) 91/58  Pulse: 83 82  Resp: (!) 22 15  Temp:    SpO2: 100% 100%    Last Pain:  Vitals:   06/30/24 1100  TempSrc:   PainSc: 0-No pain                 Rayma Hegg      "

## 2024-07-01 ENCOUNTER — Encounter (HOSPITAL_COMMUNITY): Payer: Self-pay | Admitting: Gastroenterology

## 2024-07-03 LAB — SURGICAL PATHOLOGY
